# Patient Record
Sex: Male | Born: 1945 | Race: White | Hispanic: No | Marital: Married | Smoking: Former smoker
Health system: Southern US, Community
[De-identification: ages and names within clinical notes are randomized; demographics above are authoritative.]

## PROBLEM LIST (undated history)

## (undated) DIAGNOSIS — K219 Gastro-esophageal reflux disease without esophagitis: Secondary | ICD-10-CM

## (undated) DIAGNOSIS — M1990 Unspecified osteoarthritis, unspecified site: Secondary | ICD-10-CM

## (undated) DIAGNOSIS — M2540 Effusion, unspecified joint: Secondary | ICD-10-CM

## (undated) DIAGNOSIS — R072 Precordial pain: Secondary | ICD-10-CM

## (undated) DIAGNOSIS — I10 Essential (primary) hypertension: Secondary | ICD-10-CM

## (undated) DIAGNOSIS — G43109 Migraine with aura, not intractable, without status migrainosus: Secondary | ICD-10-CM

## (undated) DIAGNOSIS — M2550 Pain in unspecified joint: Secondary | ICD-10-CM

## (undated) DIAGNOSIS — E785 Hyperlipidemia, unspecified: Secondary | ICD-10-CM

## (undated) HISTORY — DX: Essential (primary) hypertension: I10

## (undated) HISTORY — PX: COLONOSCOPY: SHX174

## (undated) HISTORY — DX: Hyperlipidemia, unspecified: E78.5

## (undated) HISTORY — DX: Precordial pain: R07.2

## (undated) HISTORY — PX: CATARACT EXTRACTION: SUR2

---
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
## 2012-10-06 ENCOUNTER — Encounter: Payer: Self-pay | Admitting: Internal Medicine

## 2012-10-06 ENCOUNTER — Ambulatory Visit (INDEPENDENT_AMBULATORY_CARE_PROVIDER_SITE_OTHER): Payer: Medicare Other | Admitting: Internal Medicine

## 2012-10-06 VITALS — BP 132/82 | HR 84 | Temp 98.1°F | Ht 76.0 in | Wt 221.0 lb

## 2012-10-06 DIAGNOSIS — I10 Essential (primary) hypertension: Secondary | ICD-10-CM

## 2012-10-06 DIAGNOSIS — R0602 Shortness of breath: Secondary | ICD-10-CM

## 2012-10-06 HISTORY — DX: Shortness of breath: R06.02

## 2012-10-06 HISTORY — DX: Essential (primary) hypertension: I10

## 2012-10-06 MED ORDER — AMLODIPINE-OLMESARTAN 5-20 MG PO TABS: 1.0000 | ORAL_TABLET | Freq: Every day | ORAL | Status: DC

## 2012-10-06 NOTE — Progress Notes (Deleted)
  Subjective:    Patient ID: Chad Gordon, male    DOB: [birth year], 67 y.o.   MRN: [id number]  HPI    Review of Systems  Constitutional: Negative for fever, chills, activity change, appetite change and unexpected weight change.  HENT: Positive for congestion. Negative for sore throat, rhinorrhea, sneezing, trouble swallowing, dental problem, voice change and postnasal drip.   Eyes: Negative for visual disturbance.  Respiratory: Positive for shortness of breath. Negative for cough and choking.   Cardiovascular: Negative for chest pain and leg swelling.  Gastrointestinal: Negative for nausea, vomiting and abdominal pain.  Genitourinary: Negative for difficulty urinating.  Musculoskeletal: Negative for arthralgias.  Skin: Negative for rash.  Psychiatric/Behavioral: Negative for behavioral problems and confusion.       Objective:   Physical Exam        Assessment & Plan:

## 2012-10-06 NOTE — Assessment & Plan Note (Signed)
ACE inhibitors are problematic in  pts with airway complaints because  even experienced pulmonologists can't always distinguish ace effects from copd/asthma/pnds/ allergies etc.  By themselves they don't actually cause a problem, much like oxygen can't by itself start a fire, but they certainly serve as a powerful catalyst or enhancer for any "fire"  or inflammatory process in the upper airway, be it caused by an ET  tube or more commonly reflux (especially in the obese or pts with known GERD or who are on biphoshonates) or URI's, due to interference with bradykinin clearance.  The effects of acei on bradykinin levels occurs in 100% of pt's on acei (unless they surreptitiously stop the med!) but the classic cough is only reported in 5%.  This leaves 95% of pts on acei's  with a variety of syndromes including no identifiable symptom in most  vs non-specific symptoms that wax and wane depending on what other insult is occuring at the level of the upper airway, like GERD  For now try azor 5/20 one daily then regroup in 6 weeks

## 2012-10-06 NOTE — Progress Notes (Signed)
Subjective:     Patient ID: Chad Gordon, male   DOB: [birth year], 67 y.o.   MRN: [id number]  HPI  55 yowm quit smoking Jan 2011 c no resp problems at all referred 10/06/2012 to pulmonary clinic by Dr Sheria Lang for eval of sob  10/06/2012 Pulmonary ov / cc new onset sob x 6 months indolent but not progressive moderate to point sob taking shower, limited walking by ankle not sob. Sometimes sob sitting still but can still do golf including inclines. Has noisy breathing per wife  No obvious daytime variabilty or assoc chronic cough or cp or chest tightness, subjective wheeze on sign sinus or hb symptoms (does have occ acid reflux and nasal congestion). No unusual exp hx or h/o childhood pna/ asthma or premature birth to his knowledge.   Sleeping ok without nocturnal  or early am exacerbation  of respiratory  c/o's or need for noct saba. Also denies any obvious fluctuation of symptoms with weather or environmental changes or other aggravating or alleviating factors except as outlined above.  ROS  The following are not active complaints unless bolded sore throat, dysphagia, dental problems, itching, sneezing,  nasal congestion or excess/ purulent secretions, ear ache,   fever, chills, sweats, unintended wt loss, pleuritic or exertional cp, hemoptysis,  orthopnea pnd or leg swelling, presyncope, palpitations, heartburn, abdominal pain, anorexia, nausea, vomiting, diarrhea  or change in bowel or urinary habits, change in stools or urine, dysuria,hematuria,  rash, arthralgias, visual complaints, headache, numbness weakness or ataxia or problems with walking or coordination,  change in mood/affect or memory.      Review of Systems     Objective:   Physical Exam   10/06/2012 wt  Very pleasant amb wm with prominent pseudowheeze  HEENT: nl dentition, turbinates, and orophanx. Nl external ear canals without cough reflex   NECK :  without JVD/Nodes/TM/ nl carotid upstrokes bilaterally   LUNGS: no acc  muscle use, clear to A and P bilaterally without cough on insp or exp maneuvers   CV:  RRR  no s3 or murmur or increase in P2, no edema   ABD:  soft and nontender with nl excursion in the supine position. No bruits or organomegaly, bowel sounds nl  MS:  warm  calf tenderness, cyanosis or clubbing - bilaterl forearm amputations  SKIN: warm and dry without lesions    NEURO:  alert, approp, no deficits  cxr 09/04/12 wnl        Assessment:           Plan:

## 2012-10-06 NOTE — Patient Instructions (Addendum)
Stop amlodipine/benapril and start azor 5/20 one daily  Continue Nexium 40 mg Take 30-60 min before first meal of the day   GERD (REFLUX)  is an extremely common cause of respiratory symptoms, many times with no significant heartburn at all.    It can be treated with medication, but also with lifestyle changes including avoidance of late meals, excessive alcohol, smoking cessation, and avoid fatty foods, chocolate, peppermint, colas, red wine, and acidic juices such as orange juice.  NO MINT OR MENTHOL PRODUCTS SO NO COUGH DROPS  USE SUGARLESS CANDY INSTEAD (jolley ranchers or Stover's)  NO OIL BASED VITAMINS - use powdered substitutes.  Please schedule a follow up office visit in 6 weeks, call sooner if needed with pfts

## 2012-10-06 NOTE — Assessment & Plan Note (Signed)
Symptoms are markedly disproportionate to objective findings and not clear this is a lung problem but pt does appear to have difficult airway management issues. DDX of  difficult airways managment all start with A and  include Adherence, Ace Inhibitors, Acid Reflux, Active Sinus Disease, Alpha 1 Antitripsin deficiency, Anxiety masquerading as Airways dz,  ABPA,  allergy(esp in young), Aspiration (esp in elderly), Adverse effects of DPI,  Active smokers, plus two Bs  = Bronchiectasis and Beta blocker use..and one C= CHF  Ace inhibitors lead the list of usual suspects here even in the absence of the classic cough > try off x 6 weeks then regroup with pft  Acid reflux > continue nexium, but note the effects of GERD and ACEI on the upper airway are like oxygen and gasoline (all it takes is a spark and pts stable for years on acei can start having upper airway issues).  Active smoking > denies.   When respiratory symptoms begin or become refractory well after a patient reports complete smoking cessation,  Especially when this wasn't the case while they were smoking, a red flag is raised based on the work of Dr Primitivo Gauze which states:  if you quit smoking when your best day FEV1 is still well preserved it is highly unlikely you will progress to severe disease.  That is to say, once the smoking stops,  the symptoms should not suddenly erupt or markedly worsen.  If so, the differential diagnosis should include  obesity/deconditioning,  LPR/Reflux/Aspiration syndromes,  occult CHF, or  especially side effect of medications commonly used in this population, esp ace inhibitors

## 2012-11-17 ENCOUNTER — Encounter: Payer: Self-pay | Admitting: Internal Medicine

## 2012-11-17 ENCOUNTER — Ambulatory Visit (INDEPENDENT_AMBULATORY_CARE_PROVIDER_SITE_OTHER): Payer: Medicare Other | Admitting: Internal Medicine

## 2012-11-17 VITALS — BP 110/68 | HR 88 | Temp 98.5°F | Ht 75.0 in | Wt 216.0 lb

## 2012-11-17 DIAGNOSIS — J310 Chronic rhinitis: Secondary | ICD-10-CM

## 2012-11-17 DIAGNOSIS — I10 Essential (primary) hypertension: Secondary | ICD-10-CM

## 2012-11-17 DIAGNOSIS — R0602 Shortness of breath: Secondary | ICD-10-CM

## 2012-11-17 HISTORY — DX: Chronic rhinitis: J31.0

## 2012-11-17 LAB — PULMONARY FUNCTION TEST

## 2012-11-17 MED ORDER — AMLODIPINE-OLMESARTAN 5-20 MG PO TABS: 1.0000 | ORAL_TABLET | Freq: Every day | ORAL | Status: DC

## 2012-11-17 MED ORDER — AZELASTINE-FLUTICASONE 137-50 MCG/ACT NA SUSP: 1.0000 | Freq: Two times a day (BID) | NASAL | Status: DC

## 2012-11-17 NOTE — Progress Notes (Signed)
Subjective:     Patient ID: Chad Gordon, male   DOB: Feb 28, 1946    MRN: [id number]  HPI  1 yowm quit smoking Jan 2011 c no resp problems at all referred 10/06/2012 to pulmonary clinic by Dr Sheria Lang for eval of sob  10/06/2012 Pulmonary ov / cc new onset sob x 6 months indolent but not progressive moderate to point sob taking shower, limited walking by ankle not sob. Sometimes sob sitting still but can still do golf including inclines. Has noisy breathing per wife. rec Stop amlodipine/benapril and start azor 5/20 one daily Continue Nexium 40 mg Take 30-60 min before first meal of the day  GERD   11/17/2012 f/u ov/Kiylee Thoreson f/u sob Chief Complaint  Patient presents with  . Follow-up    Breathing has improved some since last visit. Had PFT done today.    shower better, no longer sob sitting still - main issue is sob bending over, breathing through the nose is a problem,can't walk distance due to ankle.   No obvious daytime variabilty or assoc chronic cough or cp or chest tightness, subjective wheeze on sign sinus or hb symptoms (does have occ acid reflux and nasal congestion). No unusual exp hx or h/o childhood pna/ asthma or premature birth to his knowledge.   Sleeping ok without nocturnal  or early am exacerbation  of respiratory  c/o's or need for noct saba. Also denies any obvious fluctuation of symptoms with weather or environmental changes or other aggravating or alleviating factors except as outlined above.  ROS  The following are not active complaints unless bolded sore throat, dysphagia, dental problems, itching, sneezing,  nasal congestion or excess/ purulent secretions, ear ache,   fever, chills, sweats, unintended wt loss, pleuritic or exertional cp, hemoptysis,  orthopnea pnd or leg swelling, presyncope, palpitations, heartburn, abdominal pain, anorexia, nausea, vomiting, diarrhea  or change in bowel or urinary habits, change in stools or urine, dysuria,hematuria,  rash, arthralgias,  visual complaints, headache, numbness weakness or ataxia or problems with walking or coordination,  change in mood/affect or memory.           Objective:   Physical Exam    Wt Readings from Last 3 Encounters:  11/17/12 216 lb (97.977 kg)  10/06/12 221 lb (100.245 kg)    Very pleasant amb wm with  pseudowheeze only with fvc  HEENT: nl dentition, turbinates, and orophanx. Nl external ear canals without cough reflex   NECK :  without JVD/Nodes/TM/ nl carotid upstrokes bilaterally   LUNGS: no acc muscle use, clear to A and P bilaterally without cough on insp or exp maneuvers   CV:  RRR  no s3 or murmur or increase in P2, no edema   ABD:  soft and nontender with nl excursion in the supine position. No bruits or organomegaly, bowel sounds nl  MS:  warm  calf tenderness, cyanosis or clubbing - bilaterl forearm amputations    cxr 09/04/12 wnl        Assessment:           Plan:

## 2012-11-17 NOTE — Assessment & Plan Note (Signed)
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 I reviewed the Flethcher curve with patient that basically indicates  if you quit smoking when your best day FEV1 is still well preserved(as it certainly is)  it is highly unlikely you will progress to severe disease and informed the patient there was no medication on the market that has proven to change the curve or the likelihood of progression.  Therefore stopping smoking and maintaining abstinence is the most important aspect of care, not choice of inhalers or for that matter, doctors.   No need for pulmonary f/u for this

## 2012-11-17 NOTE — Assessment & Plan Note (Signed)
rec sinus ct and trial of dymista with ent f/u if mechanical obst and allergy eval if not and dymista not helping

## 2012-11-17 NOTE — Progress Notes (Signed)
PFT done today. 

## 2012-11-17 NOTE — Patient Instructions (Addendum)
dymista one puff twice daily to see if nasal passages improve  Please see patient coordinator before you leave today  to schedule sinus CT    If you are satisfied with your treatment plan let your doctor know and he/she can either refill your medications or you can return here when your prescription runs out.     If in any way you are not 100% satisfied,  please tell us.  If 100% better, tell your friends!

## 2012-11-17 NOTE — Assessment & Plan Note (Signed)
Ok off acei since 10/06/12 > defer final choice to Dr Sheria Lang.

## 2012-11-25 ENCOUNTER — Encounter: Payer: Self-pay | Admitting: Internal Medicine

## 2012-11-25 ENCOUNTER — Ambulatory Visit (INDEPENDENT_AMBULATORY_CARE_PROVIDER_SITE_OTHER)
Admission: RE | Admit: 2012-11-25 | Discharge: 2012-11-25 | Disposition: A | Discharge status: Home / Self Care | Payer: Medicare Other | Source: Ambulatory Visit | Attending: Internal Medicine | Admitting: Internal Medicine

## 2012-11-25 DIAGNOSIS — J310 Chronic rhinitis: Secondary | ICD-10-CM

## 2012-11-26 NOTE — Progress Notes (Signed)
Quick Note:  Spoke with pt and notified of results per Dr. Wert. Pt verbalized understanding and denied any questions.  ______ 

## 2013-02-03 HISTORY — PX: KNEE ARTHROSCOPY: SHX127

## 2013-04-30 ENCOUNTER — Other Ambulatory Visit: Payer: Self-pay | Admitting: Orthopedic Surgery

## 2013-05-08 ENCOUNTER — Encounter (HOSPITAL_COMMUNITY): Payer: Self-pay | Admitting: Pharmacy Technician

## 2013-05-11 NOTE — Pre-Procedure Instructions (Signed)
Chad Gordon  05/11/2013   Your procedure is scheduled on:  Mon, Oct 13 @ 8:45 AM  Report to [facility] Short Stay Entrance A at 6:45AM.  Call this number if you have problems the morning of surgery: [phone]   Remember:   Do not eat food or drink liquids after midnight.   Take these medicines the morning of surgery with A SIP OF WATER: Amlodipine-Olmesartan(Azor) and Nexium(Esomeprazole)               Stop taking your Aspirin and Ibuprofen.No Goody's,BC's,Aleve,Fish Oil,or any Herbal Medications   Do not wear jewelry  Do not wear lotions, powders, orcolognes. You may wear deodorant.  Men may shave face and neck.  Do not bring valuables to the hospital.  [facility] is not responsible                  for any belongings or valuables.               Contacts, dentures or bridgework may not be worn into surgery.  Leave suitcase in the car. After surgery it may be brought to your room.  For patients admitted to the hospital, discharge time is determined by your                treatment team.               Patients discharged the day of surgery will not be allowed to drive  home.    Special Instructions: Shower using CHG 2 nights before surgery and the night before surgery.  If you shower the day of surgery use CHG.  Use special wash - you have one bottle of CHG for all showers.  You should use approximately 1/3 of the bottle for each shower.   Please read over the following fact sheets that you were given: Pain Booklet, Coughing and Deep Breathing, Blood Transfusion Information, Total Joint Packet, MRSA Information and Surgical Site Infection Prevention

## 2013-05-12 ENCOUNTER — Encounter (HOSPITAL_COMMUNITY): Payer: Self-pay

## 2013-05-12 ENCOUNTER — Encounter (HOSPITAL_COMMUNITY)
Admission: RE | Admit: 2013-05-12 | Discharge: 2013-05-12 | Disposition: A | Discharge status: Home / Self Care | Payer: Medicare Other | Source: Ambulatory Visit | Attending: Orthopedic Surgery | Admitting: Orthopedic Surgery

## 2013-05-12 DIAGNOSIS — Z01818 Encounter for other preprocedural examination: Secondary | ICD-10-CM | POA: Insufficient documentation

## 2013-05-12 DIAGNOSIS — Z01810 Encounter for preprocedural cardiovascular examination: Secondary | ICD-10-CM | POA: Insufficient documentation

## 2013-05-12 DIAGNOSIS — Z01812 Encounter for preprocedural laboratory examination: Secondary | ICD-10-CM | POA: Insufficient documentation

## 2013-05-12 HISTORY — DX: Unspecified osteoarthritis, unspecified site: M19.90

## 2013-05-12 HISTORY — DX: Effusion, unspecified joint: M25.40

## 2013-05-12 HISTORY — DX: Pain in unspecified joint: M25.50

## 2013-05-12 HISTORY — DX: Gastro-esophageal reflux disease without esophagitis: K21.9

## 2013-05-12 LAB — COMPREHENSIVE METABOLIC PANEL
ALT: 29 U/L (ref 0–53)
Alkaline Phosphatase: 90 U/L (ref 39–117)
CO2: 24 mEq/L (ref 19–32)
Calcium: 9.1 mg/dL (ref 8.4–10.5)
Chloride: 103 mEq/L (ref 96–112)
GFR calc Af Amer: >90 mL/min (ref >90)
Glucose, Bld: 88 mg/dL (ref 70–99)
Potassium: 3.8 mEq/L (ref 3.5–5.1)
Sodium: 138 mEq/L (ref 135–145)
Total Bilirubin: 0.4 mg/dL (ref 0.3–1.2)
Total Protein: 6.9 g/dL (ref 6.0–8.3)

## 2013-05-12 LAB — CBC WITH DIFFERENTIAL/PLATELET
Eosinophils Absolute: 0.1 10*3/uL (ref 0.0–0.7)
Lymphocytes Relative: 18 % (ref 12–46)
Lymphs Abs: 1.9 10*3/uL (ref 0.7–4.0)
MCV: 85.1 fL (ref 78.0–100.0)
Monocytes Absolute: 0.9 10*3/uL (ref 0.1–1.0)
Monocytes Relative: 9 % (ref 3–12)
Neutrophils Relative %: 71 % (ref 43–77)
Platelets: 407 10*3/uL — ABNORMAL HIGH (ref 150–400)
RBC: 5.02 MIL/uL (ref 4.22–5.81)
WBC: 10.3 10*3/uL (ref 4.0–10.5)

## 2013-05-12 LAB — SURGICAL PCR SCREEN
MRSA, PCR: NEGATIVE
Staphylococcus aureus: NEGATIVE

## 2013-05-12 LAB — APTT: aPTT: 32 seconds (ref 24–37)

## 2013-05-12 LAB — ABO/RH: ABO/RH(D): A NEG

## 2013-05-12 LAB — TYPE AND SCREEN: Antibody Screen: NEGATIVE

## 2013-05-12 LAB — PROTIME-INR: Prothrombin Time: 13.2 seconds (ref 11.6–15.2)

## 2013-05-12 MED ORDER — CHLORHEXIDINE GLUCONATE 4 % EX LIQD: 60.0000 mL | Freq: Once | CUTANEOUS | Status: DC

## 2013-05-12 NOTE — Progress Notes (Signed)
Pt doesn't have a cardiologist  Denies ever having a stress test  Echo done about 35yrs ago-done with Dr.John Sheria Lang at [facility]  Denies ever having a heart cath  Medical MD is Dr.John Sheria Lang  CXR to be requested from [facility]  Denies ekg in past yr

## 2013-05-12 NOTE — Progress Notes (Signed)
05/12/13 1200  OBSTRUCTIVE SLEEP APNEA  Have you ever been diagnosed with sleep apnea through a sleep study? No  Do you snore loudly (loud enough to be heard through closed doors)?  1  Do you often feel tired, fatigued, or sleepy during the daytime? 0  Has anyone observed you stop breathing during your sleep? 1  Do you have, or are you being treated for high blood pressure? 1  BMI more than 35 kg/m2? 0  Age over 69 years old? 1  Neck circumference greater than 40 cm/18 inches? 0 (17)  Gender: 1  Obstructive Sleep Apnea Score 5  Score 4 or greater  Results sent to PCP

## 2013-05-14 NOTE — Progress Notes (Signed)
Anesthesia Chart Review:  Patient is a 67 year old male scheduled for right TKA on 05/18/13 by Dr. Sherlean Foot.  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  PCP is Dr. Desmond Dike at [facility].  He has cleared patient for this procedure.  Patient was also evaluated by pulmonologist Dr. Sandrea Hughs earlier this year for chronic rhinitis and SOB (possible related to ACEI).  He was also felt to have GOLD I COPD, but felt it would not likely progress if he refrained from smoking.  It appears only PRN follow-up was recommended.  EKG on 05/12/13 showed NSR. He reported a prior echo, but it was done > 3 years ago.   CXR from his PCP done on 09/04/12 showed faint infiltrate in the left upper lobe.  I will order a follow-up CXR to be done on the day of surgery.   PFT's 11/17/2012 FEV1 3.01 (87%) ratio 67 and no better p B2, DLCO 93%.  Preoperative labs noted.  He was unable to provide a urine specimen, so it will be done on the day of surgery.  He has been cleared by his PCP, so if no acute changes then I would anticipate that he could proceed as planned.  Velna Ochs [facility] Short Stay Center/Anesthesiology Phone [phone] 05/14/2013 1:59 PM

## 2013-05-17 MED ORDER — BUPIVACAINE LIPOSOME 1.3 % IJ SUSP
20.0000 mL | Freq: Once | INTRAMUSCULAR | Status: DC
  Filled 2013-05-17: qty 20

## 2013-05-17 MED ORDER — DEXTROSE 5 % IV SOLN
3.0000 g | INTRAVENOUS | Status: AC
  Administered 2013-05-18: 3 g via INTRAVENOUS
  Filled 2013-05-17: qty 3000

## 2013-05-17 MED ORDER — TRANEXAMIC ACID 100 MG/ML IV SOLN
1000.0000 mg | INTRAVENOUS | Status: AC
  Administered 2013-05-18: 1000 mg via INTRAVENOUS
  Filled 2013-05-17: qty 10

## 2013-05-17 MED ORDER — SODIUM CHLORIDE 0.9 % IV SOLN: INTRAVENOUS | Status: DC

## 2013-05-18 ENCOUNTER — Encounter (HOSPITAL_COMMUNITY)
Admission: RE | Disposition: A | Discharge status: Home Health | Payer: Self-pay | Source: Ambulatory Visit | Attending: Orthopedic Surgery

## 2013-05-18 ENCOUNTER — Encounter (HOSPITAL_COMMUNITY): Payer: Self-pay | Admitting: Certified Registered"

## 2013-05-18 ENCOUNTER — Inpatient Hospital Stay (HOSPITAL_COMMUNITY): Payer: Medicare Other | Admitting: Certified Registered"

## 2013-05-18 ENCOUNTER — Encounter (HOSPITAL_COMMUNITY): Payer: Medicare Other | Admitting: Vascular Surgery

## 2013-05-18 ENCOUNTER — Inpatient Hospital Stay (HOSPITAL_COMMUNITY)
Admission: RE | Admit: 2013-05-18 | Discharge: 2013-05-19 | DRG: 470 | Disposition: A | Discharge status: Home Health | Payer: Medicare Other | Source: Ambulatory Visit | Attending: Orthopedic Surgery | Admitting: Orthopedic Surgery

## 2013-05-18 ENCOUNTER — Inpatient Hospital Stay (HOSPITAL_COMMUNITY): Payer: Medicare Other

## 2013-05-18 DIAGNOSIS — Z23 Encounter for immunization: Secondary | ICD-10-CM

## 2013-05-18 DIAGNOSIS — M1710 Unilateral primary osteoarthritis, unspecified knee: Principal | ICD-10-CM | POA: Diagnosis present

## 2013-05-18 DIAGNOSIS — J310 Chronic rhinitis: Secondary | ICD-10-CM | POA: Diagnosis present

## 2013-05-18 DIAGNOSIS — D62 Acute posthemorrhagic anemia: Secondary | ICD-10-CM | POA: Diagnosis not present

## 2013-05-18 DIAGNOSIS — Z96651 Presence of right artificial knee joint: Secondary | ICD-10-CM

## 2013-05-18 DIAGNOSIS — Z87891 Personal history of nicotine dependence: Secondary | ICD-10-CM

## 2013-05-18 DIAGNOSIS — K219 Gastro-esophageal reflux disease without esophagitis: Secondary | ICD-10-CM | POA: Diagnosis present

## 2013-05-18 DIAGNOSIS — I10 Essential (primary) hypertension: Secondary | ICD-10-CM | POA: Diagnosis present

## 2013-05-18 DIAGNOSIS — E785 Hyperlipidemia, unspecified: Secondary | ICD-10-CM | POA: Diagnosis present

## 2013-05-18 HISTORY — PX: TOTAL KNEE ARTHROPLASTY: SHX125

## 2013-05-18 LAB — CREATININE, SERUM: Creatinine, Ser: 0.96 mg/dL (ref 0.50–1.35)

## 2013-05-18 LAB — CBC
HCT: 40.1 % (ref 39.0–52.0)
MCH: 28.6 pg (ref 26.0–34.0)
MCV: 85.5 fL (ref 78.0–100.0)
Platelets: 368 10*3/uL (ref 150–400)
RBC: 4.69 MIL/uL (ref 4.22–5.81)
RDW: 15 % (ref 11.5–15.5)

## 2013-05-18 LAB — URINALYSIS, ROUTINE W REFLEX MICROSCOPIC
Bilirubin Urine: NEGATIVE
Bilirubin Urine: NEGATIVE
Glucose, UA: NEGATIVE mg/dL
Glucose, UA: NEGATIVE mg/dL
Hgb urine dipstick: NEGATIVE
Leukocytes, UA: NEGATIVE
Nitrite: NEGATIVE
Protein, ur: NEGATIVE mg/dL
Specific Gravity, Urine: 1.023 (ref 1.005–1.030)
Urobilinogen, UA: 0.2 mg/dL (ref 0.0–1.0)
Urobilinogen, UA: 0.2 mg/dL (ref 0.0–1.0)
pH: 6 (ref 5.0–8.0)

## 2013-05-18 SURGERY — ARTHROPLASTY, KNEE, TOTAL
Anesthesia: General | Site: Knee | Laterality: Right | Wound class: Clean

## 2013-05-18 MED ORDER — MENTHOL 3 MG MT LOZG: 1.0000 | LOZENGE | OROMUCOSAL | Status: DC | PRN

## 2013-05-18 MED ORDER — HYDROMORPHONE HCL PF 1 MG/ML IJ SOLN
0.2500 mg | INTRAMUSCULAR | Status: DC | PRN
  Administered 2013-05-18 (×2): 0.5 mg via INTRAVENOUS

## 2013-05-18 MED ORDER — METOCLOPRAMIDE HCL 5 MG/ML IJ SOLN
5.0000 mg | Freq: Three times a day (TID) | INTRAMUSCULAR | Status: DC | PRN
  Administered 2013-05-18: 10 mg via INTRAVENOUS
  Filled 2013-05-18: qty 2

## 2013-05-18 MED ORDER — SODIUM CHLORIDE 0.9 % IV SOLN
INTRAVENOUS | Status: DC
  Administered 2013-05-18: 17:00:00 via INTRAVENOUS

## 2013-05-18 MED ORDER — OXYCODONE HCL ER 10 MG PO T12A
10.0000 mg | EXTENDED_RELEASE_TABLET | Freq: Two times a day (BID) | ORAL | Status: DC
  Administered 2013-05-18 – 2013-05-19 (×2): 10 mg via ORAL
  Filled 2013-05-18 (×2): qty 1

## 2013-05-18 MED ORDER — CEFAZOLIN SODIUM-DEXTROSE 2-3 GM-% IV SOLR
2.0000 g | Freq: Four times a day (QID) | INTRAVENOUS | Status: AC
  Administered 2013-05-18: 2 g via INTRAVENOUS
  Filled 2013-05-18 (×2): qty 50

## 2013-05-18 MED ORDER — ONDANSETRON HCL 4 MG PO TABS: 4.0000 mg | ORAL_TABLET | Freq: Four times a day (QID) | ORAL | Status: DC | PRN

## 2013-05-18 MED ORDER — ARTIFICIAL TEARS OP OINT
TOPICAL_OINTMENT | OPHTHALMIC | Status: DC | PRN
  Administered 2013-05-18: 1 via OPHTHALMIC

## 2013-05-18 MED ORDER — PROMETHAZINE HCL 12.5 MG PO TABS: 12.5000 mg | ORAL_TABLET | Freq: Four times a day (QID) | ORAL | Status: DC | PRN

## 2013-05-18 MED ORDER — MIDAZOLAM HCL 2 MG/2ML IJ SOLN: 0.5000 mg | Freq: Once | INTRAMUSCULAR | Status: DC | PRN

## 2013-05-18 MED ORDER — FENTANYL CITRATE 0.05 MG/ML IJ SOLN
50.0000 ug | INTRAMUSCULAR | Status: DC | PRN
  Administered 2013-05-18: 100 ug via INTRAVENOUS
  Filled 2013-05-18: qty 2

## 2013-05-18 MED ORDER — FENTANYL CITRATE 0.05 MG/ML IJ SOLN
INTRAMUSCULAR | Status: DC | PRN
  Administered 2013-05-18: 25 ug via INTRAVENOUS
  Administered 2013-05-18: 50 ug via INTRAVENOUS
  Administered 2013-05-18: 100 ug via INTRAVENOUS
  Administered 2013-05-18 (×2): 50 ug via INTRAVENOUS
  Administered 2013-05-18: 25 ug via INTRAVENOUS

## 2013-05-18 MED ORDER — LACTATED RINGERS IV SOLN
INTRAVENOUS | Status: DC | PRN
  Administered 2013-05-18 (×3): via INTRAVENOUS

## 2013-05-18 MED ORDER — METHOCARBAMOL 100 MG/ML IJ SOLN
500.0000 mg | Freq: Four times a day (QID) | INTRAVENOUS | Status: DC | PRN
  Filled 2013-05-18: qty 5

## 2013-05-18 MED ORDER — SIMVASTATIN 20 MG PO TABS
20.0000 mg | ORAL_TABLET | Freq: Every day | ORAL | Status: DC
  Administered 2013-05-18: 20 mg via ORAL
  Filled 2013-05-18 (×2): qty 1

## 2013-05-18 MED ORDER — DIPHENHYDRAMINE HCL 12.5 MG/5ML PO ELIX: 12.5000 mg | ORAL_SOLUTION | ORAL | Status: DC | PRN

## 2013-05-18 MED ORDER — LACTATED RINGERS IV SOLN
INTRAVENOUS | Status: DC
  Administered 2013-05-18: 08:00:00 via INTRAVENOUS

## 2013-05-18 MED ORDER — ZOLPIDEM TARTRATE 5 MG PO TABS: 5.0000 mg | ORAL_TABLET | Freq: Every evening | ORAL | Status: DC | PRN

## 2013-05-18 MED ORDER — PROMETHAZINE HCL 25 MG/ML IJ SOLN
12.5000 mg | Freq: Four times a day (QID) | INTRAMUSCULAR | Status: DC | PRN
  Administered 2013-05-18: 12.5 mg via INTRAVENOUS
  Filled 2013-05-18: qty 1

## 2013-05-18 MED ORDER — BISACODYL 5 MG PO TBEC: 5.0000 mg | DELAYED_RELEASE_TABLET | Freq: Every day | ORAL | Status: DC | PRN

## 2013-05-18 MED ORDER — PHENOL 1.4 % MT LIQD: 1.0000 | OROMUCOSAL | Status: DC | PRN

## 2013-05-18 MED ORDER — CELECOXIB 200 MG PO CAPS
200.0000 mg | ORAL_CAPSULE | Freq: Two times a day (BID) | ORAL | Status: DC
  Administered 2013-05-18 – 2013-05-19 (×3): 200 mg via ORAL
  Filled 2013-05-18 (×4): qty 1

## 2013-05-18 MED ORDER — ONDANSETRON HCL 4 MG/2ML IJ SOLN
INTRAMUSCULAR | Status: DC | PRN
  Administered 2013-05-18: 4 mg via INTRAMUSCULAR

## 2013-05-18 MED ORDER — PHENYLEPHRINE HCL 10 MG/ML IJ SOLN
INTRAMUSCULAR | Status: DC | PRN
  Administered 2013-05-18 (×5): 80 ug via INTRAVENOUS
  Administered 2013-05-18: 40 ug via INTRAVENOUS
  Administered 2013-05-18: 80 ug via INTRAVENOUS
  Administered 2013-05-18: 40 ug via INTRAVENOUS
  Administered 2013-05-18: 80 ug via INTRAVENOUS

## 2013-05-18 MED ORDER — EPHEDRINE SULFATE 50 MG/ML IJ SOLN
INTRAMUSCULAR | Status: DC | PRN
  Administered 2013-05-18: 5 mg via INTRAVENOUS

## 2013-05-18 MED ORDER — SODIUM CHLORIDE 0.9 % IJ SOLN
INTRAMUSCULAR | Status: DC | PRN
  Administered 2013-05-18: 09:00:00

## 2013-05-18 MED ORDER — AMLODIPINE-OLMESARTAN 5-20 MG PO TABS: 1.0000 | ORAL_TABLET | Freq: Every day | ORAL | Status: DC

## 2013-05-18 MED ORDER — OXYCODONE HCL 5 MG PO TABS
5.0000 mg | ORAL_TABLET | ORAL | Status: DC | PRN
  Administered 2013-05-18 – 2013-05-19 (×2): 10 mg via ORAL
  Filled 2013-05-18 (×2): qty 2

## 2013-05-18 MED ORDER — BUPIVACAINE HCL (PF) 0.5 % IJ SOLN
INTRAMUSCULAR | Status: DC | PRN
  Administered 2013-05-18: 30 mL

## 2013-05-18 MED ORDER — MIDAZOLAM HCL 2 MG/2ML IJ SOLN
1.0000 mg | INTRAMUSCULAR | Status: DC | PRN
  Administered 2013-05-18: 2 mg via INTRAVENOUS
  Filled 2013-05-18: qty 2

## 2013-05-18 MED ORDER — HYDROMORPHONE HCL PF 1 MG/ML IJ SOLN
1.0000 mg | INTRAMUSCULAR | Status: DC | PRN
  Administered 2013-05-18 (×2): 1 mg via INTRAVENOUS
  Filled 2013-05-18 (×2): qty 1

## 2013-05-18 MED ORDER — IRBESARTAN 150 MG PO TABS
150.0000 mg | ORAL_TABLET | Freq: Every day | ORAL | Status: DC
  Administered 2013-05-19: 150 mg via ORAL
  Filled 2013-05-18: qty 1

## 2013-05-18 MED ORDER — METHOCARBAMOL 500 MG PO TABS
500.0000 mg | ORAL_TABLET | Freq: Four times a day (QID) | ORAL | Status: DC | PRN
  Administered 2013-05-18 (×2): 500 mg via ORAL
  Filled 2013-05-18 (×2): qty 1

## 2013-05-18 MED ORDER — OXYCODONE HCL 5 MG PO TABS: 5.0000 mg | ORAL_TABLET | Freq: Once | ORAL | Status: DC | PRN

## 2013-05-18 MED ORDER — PANTOPRAZOLE SODIUM 40 MG PO TBEC
80.0000 mg | DELAYED_RELEASE_TABLET | Freq: Every day | ORAL | Status: DC
  Administered 2013-05-19: 80 mg via ORAL
  Filled 2013-05-18: qty 2

## 2013-05-18 MED ORDER — SENNOSIDES-DOCUSATE SODIUM 8.6-50 MG PO TABS: 1.0000 | ORAL_TABLET | Freq: Every evening | ORAL | Status: DC | PRN

## 2013-05-18 MED ORDER — LIDOCAINE HCL (CARDIAC) 20 MG/ML IV SOLN
INTRAVENOUS | Status: DC | PRN
  Administered 2013-05-18: 30 mg via INTRAVENOUS

## 2013-05-18 MED ORDER — ACETAMINOPHEN 650 MG RE SUPP: 650.0000 mg | Freq: Four times a day (QID) | RECTAL | Status: DC | PRN

## 2013-05-18 MED ORDER — ENOXAPARIN SODIUM 30 MG/0.3ML ~~LOC~~ SOLN
30.0000 mg | Freq: Two times a day (BID) | SUBCUTANEOUS | Status: DC
Start: 2013-05-19 — End: 2013-05-19
  Administered 2013-05-19: 30 mg via SUBCUTANEOUS
  Filled 2013-05-18 (×3): qty 0.3

## 2013-05-18 MED ORDER — ACETAMINOPHEN 325 MG PO TABS: 650.0000 mg | ORAL_TABLET | Freq: Four times a day (QID) | ORAL | Status: DC | PRN

## 2013-05-18 MED ORDER — INFLUENZA VAC SPLIT QUAD 0.5 ML IM SUSP
0.5000 mL | INTRAMUSCULAR | Status: AC
  Administered 2013-05-19: 0.5 mL via INTRAMUSCULAR
  Filled 2013-05-18: qty 0.5

## 2013-05-18 MED ORDER — METOCLOPRAMIDE HCL 10 MG PO TABS: 5.0000 mg | ORAL_TABLET | Freq: Three times a day (TID) | ORAL | Status: DC | PRN

## 2013-05-18 MED ORDER — ALUM & MAG HYDROXIDE-SIMETH 200-200-20 MG/5ML PO SUSP: 30.0000 mL | ORAL | Status: DC | PRN

## 2013-05-18 MED ORDER — HYDROMORPHONE HCL PF 1 MG/ML IJ SOLN
INTRAMUSCULAR | Status: AC
  Administered 2013-05-18: 0.5 mg via INTRAVENOUS
  Filled 2013-05-18: qty 1

## 2013-05-18 MED ORDER — SODIUM CHLORIDE 0.9 % IR SOLN
Status: DC | PRN
  Administered 2013-05-18: 3000 mL

## 2013-05-18 MED ORDER — PROPOFOL 10 MG/ML IV BOLUS
INTRAVENOUS | Status: DC | PRN
  Administered 2013-05-18: 160 mg via INTRAVENOUS

## 2013-05-18 MED ORDER — DOCUSATE SODIUM 100 MG PO CAPS
100.0000 mg | ORAL_CAPSULE | Freq: Two times a day (BID) | ORAL | Status: DC
  Administered 2013-05-18 – 2013-05-19 (×2): 100 mg via ORAL
  Filled 2013-05-18 (×3): qty 1

## 2013-05-18 MED ORDER — PROMETHAZINE HCL 25 MG RE SUPP: 12.5000 mg | Freq: Four times a day (QID) | RECTAL | Status: DC | PRN

## 2013-05-18 MED ORDER — FLEET ENEMA 7-19 GM/118ML RE ENEM: 1.0000 | ENEMA | Freq: Once | RECTAL | Status: AC | PRN

## 2013-05-18 MED ORDER — AMLODIPINE BESYLATE 5 MG PO TABS
5.0000 mg | ORAL_TABLET | Freq: Every day | ORAL | Status: DC
  Administered 2013-05-19: 5 mg via ORAL
  Filled 2013-05-18: qty 1

## 2013-05-18 MED ORDER — ONDANSETRON HCL 4 MG/2ML IJ SOLN
4.0000 mg | Freq: Four times a day (QID) | INTRAMUSCULAR | Status: DC | PRN
  Administered 2013-05-18: 4 mg via INTRAVENOUS
  Filled 2013-05-18: qty 2

## 2013-05-18 MED ORDER — OXYCODONE HCL 5 MG/5ML PO SOLN: 5.0000 mg | Freq: Once | ORAL | Status: DC | PRN

## 2013-05-18 SURGICAL SUPPLY — 55 items
BANDAGE ESMARK 6X9 LF (GAUZE/BANDAGES/DRESSINGS) ×1 IMPLANT
BLADE SAGITTAL 13X1.27X60 (BLADE) ×2 IMPLANT
BLADE SAW SGTL 83.5X18.5 (BLADE) ×2 IMPLANT
BNDG ESMARK 6X9 LF (GAUZE/BANDAGES/DRESSINGS) ×2
BOWL SMART MIX CTS (DISPOSABLE) ×2 IMPLANT
CAP POR TM CP VIT E LN CER HD ×2 IMPLANT
CEMENT BONE SIMPLEX SPEEDSET (Cement) ×4 IMPLANT
CLOTH BEACON ORANGE TIMEOUT ST (SAFETY) IMPLANT
COVER SURGICAL LIGHT HANDLE (MISCELLANEOUS) ×2 IMPLANT
CUFF TOURNIQUET SINGLE 34IN LL (TOURNIQUET CUFF) ×2 IMPLANT
DRAPE EXTREMITY T 121X128X90 (DRAPE) ×2 IMPLANT
DRAPE INCISE IOBAN 66X45 STRL (DRAPES) ×4 IMPLANT
DRAPE PROXIMA HALF (DRAPES) ×2 IMPLANT
DRAPE U-SHAPE 47X51 STRL (DRAPES) ×2 IMPLANT
DRSG ADAPTIC 3X8 NADH LF (GAUZE/BANDAGES/DRESSINGS) ×2 IMPLANT
DRSG PAD ABDOMINAL 8X10 ST (GAUZE/BANDAGES/DRESSINGS) ×2 IMPLANT
DURAPREP 26ML APPLICATOR (WOUND CARE) ×4 IMPLANT
ELECT REM PT RETURN 9FT ADLT (ELECTROSURGICAL) ×2
ELECTRODE REM PT RTRN 9FT ADLT (ELECTROSURGICAL) ×1 IMPLANT
EVACUATOR 1/8 PVC DRAIN (DRAIN) ×2 IMPLANT
GLOVE BIOGEL M 7.0 STRL (GLOVE) ×2 IMPLANT
GLOVE BIOGEL PI IND STRL 7.5 (GLOVE) ×1 IMPLANT
GLOVE BIOGEL PI IND STRL 8.5 (GLOVE) ×1 IMPLANT
GLOVE BIOGEL PI INDICATOR 7.5 (GLOVE) ×1
GLOVE BIOGEL PI INDICATOR 8.5 (GLOVE) ×1
GLOVE SURG ORTHO 8.0 STRL STRW (GLOVE) ×4 IMPLANT
GOWN PREVENTION PLUS XLARGE (GOWN DISPOSABLE) ×6 IMPLANT
GOWN STRL NON-REIN LRG LVL3 (GOWN DISPOSABLE) IMPLANT
HANDPIECE INTERPULSE COAX TIP (DISPOSABLE) ×1
HOOD PEEL AWAY FACE SHEILD DIS (HOOD) ×6 IMPLANT
KIT BASIN OR (CUSTOM PROCEDURE TRAY) ×2 IMPLANT
KIT ROOM TURNOVER OR (KITS) ×2 IMPLANT
MANIFOLD NEPTUNE II (INSTRUMENTS) ×2 IMPLANT
NEEDLE 22X1 1/2 (OR ONLY) (NEEDLE) ×2 IMPLANT
NEEDLE HYPO 21X1.5 SAFETY (NEEDLE) ×2 IMPLANT
NS IRRIG 1000ML POUR BTL (IV SOLUTION) ×2 IMPLANT
PACK TOTAL JOINT (CUSTOM PROCEDURE TRAY) ×2 IMPLANT
PAD ARMBOARD 7.5X6 YLW CONV (MISCELLANEOUS) ×4 IMPLANT
PADDING CAST COTTON 6X4 STRL (CAST SUPPLIES) ×2 IMPLANT
POSITIONER HEAD PRONE TRACH (MISCELLANEOUS) ×2 IMPLANT
SET HNDPC FAN SPRY TIP SCT (DISPOSABLE) ×1 IMPLANT
SPONGE GAUZE 4X4 12PLY (GAUZE/BANDAGES/DRESSINGS) ×2 IMPLANT
STAPLER VISISTAT 35W (STAPLE) ×2 IMPLANT
SUCTION FRAZIER TIP 10 FR DISP (SUCTIONS) ×2 IMPLANT
SUT BONE WAX W31G (SUTURE) ×2 IMPLANT
SUT VIC AB 0 CTB1 27 (SUTURE) ×4 IMPLANT
SUT VIC AB 1 CT1 27 (SUTURE) ×2
SUT VIC AB 1 CT1 27XBRD ANBCTR (SUTURE) ×2 IMPLANT
SUT VIC AB 2-0 CT1 27 (SUTURE) ×2
SUT VIC AB 2-0 CT1 TAPERPNT 27 (SUTURE) ×2 IMPLANT
SYR CONTROL 10ML LL (SYRINGE) ×2 IMPLANT
TOWEL OR 17X24 6PK STRL BLUE (TOWEL DISPOSABLE) ×2 IMPLANT
TOWEL OR 17X26 10 PK STRL BLUE (TOWEL DISPOSABLE) ×2 IMPLANT
TRAY FOLEY CATH 16FRSI W/METER (SET/KITS/TRAYS/PACK) IMPLANT
WATER STERILE IRR 1000ML POUR (IV SOLUTION) ×4 IMPLANT

## 2013-05-18 NOTE — Transfer of Care (Signed)
Immediate Anesthesia Transfer of Care Note  Patient: Chad Gordon  Procedure(s) Performed: Procedure(s): TOTAL KNEE ARTHROPLASTY (Right)  Patient Location: PACU  Anesthesia Type:GA combined with regional for post-op pain  Level of Consciousness: awake, alert  and oriented  Airway & Oxygen Therapy: Patient Spontanous Breathing and Patient connected to nasal cannula oxygen  Post-op Assessment: Report given to PACU RN, Post -op Vital signs reviewed and stable and Patient moving all extremities X 4  Post vital signs: Reviewed and stable  Complications: No apparent anesthesia complications

## 2013-05-18 NOTE — Anesthesia Postprocedure Evaluation (Signed)
  Anesthesia Post-op Note  Patient: Chad Gordon  Procedure(s) Performed: Procedure(s): TOTAL KNEE ARTHROPLASTY (Right)  Patient Location: PACU  Anesthesia Type: general with adductor canal block  Level of Consciousness: awake, alert  and oriented  Airway and Oxygen Therapy: Patient Spontanous Breathing and Patient connected to nasal cannula oxygen  Post-op Pain: mild  Post-op Assessment: Post-op Vital signs reviewed, Patient's Cardiovascular Status Stable, Respiratory Function Stable, Patent Airway and Pain level controlled  Post-op Vital Signs: stable  Complications: No apparent anesthesia complications

## 2013-05-18 NOTE — H&P (Signed)
Chad Gordon MRN:  [id number] DOB/SEX:  25-Jan-1946/male  CHIEF COMPLAINT:  Painful right Knee  HISTORY: Patient is a 67 y.o. male presented with a history of pain in the right knee. Onset of symptoms was gradual starting several years ago with gradually worsening course since that time. Prior procedures on the knee include arthroscopy. Patient has been treated conservatively with over-the-counter NSAIDs and activity modification. Patient currently rates pain in the knee at 10 out of 10 with activity. There is pain at night.  PAST MEDICAL HISTORY: Patient Active Problem List   Diagnosis Date Noted  . Chronic rhinitis 11/17/2012  . SOB (shortness of breath) 10/06/2012  . HBP (high blood pressure) 10/06/2012   Past Medical History  Diagnosis Date  . Hyperlipidemia     takes Pravastatin nightly  . Hypertension     takes Azor daily  . History of migraine     ocular and last one a yr ago  . Arthritis   . Joint pain   . Joint swelling   . GERD (gastroesophageal reflux disease)     takes Nexium daily   Past Surgical History  Procedure Laterality Date  . Right knee arthroscopy    . Colonoscopy       MEDICATIONS:   Prescriptions prior to admission  Medication Sig Dispense Refill  . amLODipine-olmesartan (AZOR) 5-20 MG per tablet Take 1 tablet by mouth daily.  30 tablet  11  . aspirin EC 81 MG tablet Take 81 mg by mouth every other day.      . celecoxib (CELEBREX) 200 MG capsule Take 200 mg by mouth daily after supper.      . esomeprazole (NEXIUM) 40 MG capsule Take 40 mg by mouth daily.      Marland Kitchen ibuprofen (ADVIL,MOTRIN) 200 MG tablet Take 600 mg by mouth daily as needed for pain.      . pravastatin (PRAVACHOL) 40 MG tablet Take 40 mg by mouth at bedtime.         ALLERGIES:  No Known Allergies  REVIEW OF SYSTEMS:  Pertinent items are noted in HPI.   FAMILY HISTORY:   Family History  Problem Relation Age of Onset  . Clotting disorder Mother     SOCIAL HISTORY:    History  Substance Use Topics  . Smoking status: Former Smoker -- 1.00 packs/day for 40 years    Types: Cigarettes  . Smokeless tobacco: Never Used     Comment: quit 3 1/84yrs ago  . Alcohol Use: 0.0 oz/week     Comment: 1 drink every other day     EXAMINATION:  Vital signs in last 24 hours:    General appearance: alert, cooperative and no distress Lungs: clear to auscultation bilaterally Heart: regular rate and rhythm, S1, S2 normal, no murmur, click, rub or gallop Abdomen: soft, non-tender; bowel sounds normal; no masses,  no organomegaly Extremities: extremities normal, atraumatic, no cyanosis or edema and Homans sign is negative, no sign of DVT Pulses: 2+ and symmetric Skin: Skin color, texture, turgor normal. No rashes or lesions Neurologic: Alert and oriented X 3, normal strength and tone. Normal symmetric reflexes. Normal coordination and gait  Musculoskeletal:  ROM 0-120, Ligaments INTACT,  Imaging Review Plain radiographs demonstrate severe degenerative joint disease of the right knee. The overall alignment is mild valgus. The bone quality appears to be good for age and reported activity level.  Assessment/Plan: End stage arthritis, right knee   The patient history, physical examination and imaging studies are consistent with  advanced degenerative joint disease of the right knee. The patient has failed conservative treatment.  The clearance notes were reviewed.  After discussion with the patient it was felt that Total Knee Replacement was indicated. The procedure,  risks, and benefits of total knee arthroplasty were presented and reviewed. The risks including but not limited to aseptic loosening, infection, blood clots, vascular injury, stiffness, patella tracking problems complications among others were discussed. The patient acknowledged the explanation, agreed to proceed with the plan.  Chad Gordon 05/18/2013, 6:40 AM

## 2013-05-18 NOTE — Anesthesia Preprocedure Evaluation (Addendum)
Anesthesia Evaluation  Patient identified by MRN, date of birth, ID band Patient awake    Reviewed: Allergy & Precautions, H&P , NPO status , Patient's Chart, lab work & pertinent test results, reviewed documented beta blocker date and time   Airway Mallampati: II TM Distance: >3 FB Neck ROM: Full    Dental  (+) Edentulous Upper, Missing, Partial Lower and Dental Advisory Given   Pulmonary shortness of breath, former smoker,          Cardiovascular hypertension,     Neuro/Psych    GI/Hepatic GERD-  ,  Endo/Other    Renal/GU      Musculoskeletal   Abdominal   Peds  Hematology   Anesthesia Other Findings   Reproductive/Obstetrics                           Anesthesia Physical Anesthesia Plan  ASA: II  Anesthesia Plan:    Post-op Pain Management:    Induction:   Airway Management Planned:   Additional Equipment:   Intra-op Plan:   Post-operative Plan:   Informed Consent:   Plan Discussed with:   Anesthesia Plan Comments:         Anesthesia Quick Evaluation

## 2013-05-18 NOTE — Progress Notes (Signed)
Merry Lofty., CRNA at bedside for block.

## 2013-05-18 NOTE — Plan of Care (Signed)
Problem: Consults Goal: Diagnosis- Total Joint Replacement Primary Total Knee Right     

## 2013-05-18 NOTE — Progress Notes (Signed)
Orthopedic Tech Progress Note Patient Details:  Chad Gordon 1945/11/29 [id number]  CPM Right Knee CPM Right Knee: On Right Knee Flexion (Degrees): 90 Right Knee Extension (Degrees): 0 Additional Comments: foot roll   Cammer, Mickie Bail 05/18/2013, 12:15 PM

## 2013-05-18 NOTE — Anesthesia Procedure Notes (Signed)
Anesthesia Regional Block:  Adductor canal block  Pre-Anesthetic Checklist: ,, timeout performed, Correct Patient, Correct Site, Correct Laterality, Correct Procedure, Correct Position, site marked, Risks and benefits discussed,  Surgical consent,  Pre-op evaluation,  At surgeon's request and post-op pain management  Laterality: Right  Prep: chloraprep       Needles:  Injection technique: Single-shot  Needle Type: Echogenic Stimulator Needle     Needle Length:cm 9 cm Needle Gauge: 22 and 22 G    Additional Needles: Adductor canal block Narrative:  Start time: 05/18/2013 8:30 AM End time: 05/18/2013 8:35 AM Injection made incrementally with aspirations every 5 mL.  Performed by: Personally   Additional Notes: 30 cc 0.5% marcaine with 1:200 Epi and 4 mg decadron injected easily.

## 2013-05-18 NOTE — Progress Notes (Signed)
UR COMPLETED  

## 2013-05-18 NOTE — Evaluation (Signed)
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  Patient will benefit from skilled PT to increase their independence and safety with mobility to allow discharge to the venue listed below.       PT Assessment  Patient needs continued PT services    Follow Up Recommendations  Home health PT;Supervision/Assistance - 24 hour    Does the patient have the potential to tolerate intense rehabilitation      Barriers to Discharge        Equipment Recommendations  3in1 (PT);Other (comment) (Bilateral platform RW)    Recommendations for Other Services OT consult   Frequency 7X/week    Precautions / Restrictions Precautions Precautions: Knee Restrictions Weight Bearing Restrictions: Yes RLE Weight Bearing: Weight bearing as tolerated   Pertinent Vitals/Pain 4/10 R knee pain; patient repositioned for comfort and optimal knee ext      Mobility  Bed Mobility Bed Mobility: Supine to Sit;Sit to Supine Supine to Sit: 5: Supervision Sit to Supine: 5: Supervision Details for Bed Mobility Assistance: Overall smooth motion Transfers Transfers: Sit to Stand;Stand to Sit Sit to Stand: 4: Min assist;From bed Stand to Sit: 4: Min assist;To chair/3-in-1 Details for Transfer Assistance: Min assist to steady bil platform RW as pt initiated sit to stand with elbows on platforms; Cues for tench[place] Ambulation/Gait Ambulation/Gait Assistance: 4: Min Environmental consultant (Feet): 5 Feet Assistive device: Bilateral  platform walker Ambulation/Gait Assistance Details: Cues for gait sequence adn to activate R quad for stance stability; Noted some buckling, but pt able to steady himself on platforms Gait Pattern: Step-to pattern    Exercises     PT Diagnosis: Difficulty walking;Acute pain  PT Problem List: Decreased strength;Decreased range of motion;Decreased activity tolerance;Decreased balance;Decreased mobility;Decreased coordination;Decreased knowledge of use of DME;Pain PT Treatment Interventions: DME instruction;Gait training;Stair training;Functional mobility training;Therapeutic activities;Therapeutic exercise;Patient/family education     PT Goals(Current goals can be found in the care plan section) Acute Rehab PT Goals Patient Stated Goal: back to golf PT Goal Formulation: With patient Time For Goal Achievement: 05/25/13 Potential to Achieve Goals: Good  Visit Information  Last PT Received On: 05/18/13 Assistance Needed: +1 History of Present Illness: s/p RTKA       Prior Functioning  Home Living Family/patient expects to be discharged to:: Private residence Living Arrangements: Spouse/significant other Available Help at Discharge: Family;Available PRN/intermittently;Available 24 hours/day Type of Home: House Home Access: Stairs to enter [employer] of Steps: 3 Entrance Stairs-Rails: None Home Layout: One level Home Equipment: None Prior Function Level of Independence: Independent Communication Communication: No difficulties    Cognition  Cognition Arousal/Alertness: Awake/alert Behavior During Therapy: WFL for tasks assessed/performed Overall Cognitive Status: Within Functional Limits for tasks assessed    Extremity/Trunk Assessment Upper Extremity Assessment Upper Extremity Assessment: Defer to OT evaluation (foream amputations happened  early childhood) Lower Extremity Assessment Lower Extremity Assessment: RLE deficits/detail RLE Deficits / Details: Decr  strength, knee control postop; quad activation present; minimal quad lag with straight leg raise   Balance    End of Session PT - End of Session Equipment  Utilized During Treatment: Gait belt Activity Tolerance: Patient tolerated treatment well Patient left: in chair;with call bell/phone within reach Nurse Communication: Mobility status CPM Right Knee CPM Right Knee: Off Right Knee Flexion (Degrees): 90 Right Knee Extension (Degrees): 0 Additional Comments: completed 2 hours 15 min  GP     Van Clines [facility] [place], [place] [phone]  05/18/2013, 4:49 PM

## 2013-05-19 LAB — BASIC METABOLIC PANEL
BUN: 16 mg/dL (ref 6–23)
Chloride: 102 mEq/L (ref 96–112)
GFR calc Af Amer: >90 mL/min (ref >90)
GFR calc non Af Amer: 84 mL/min — ABNORMAL LOW (ref >90)
Potassium: 4.2 mEq/L (ref 3.5–5.1)
Sodium: 138 mEq/L (ref 135–145)

## 2013-05-19 LAB — CBC
HCT: 35.4 % — ABNORMAL LOW (ref 39.0–52.0)
Hemoglobin: 12.1 g/dL — ABNORMAL LOW (ref 13.0–17.0)
RDW: 14.9 % (ref 11.5–15.5)
WBC: 12.9 10*3/uL — ABNORMAL HIGH (ref 4.0–10.5)

## 2013-05-19 MED ORDER — OXYCODONE HCL 5 MG PO TABS: 5.0000 mg | ORAL_TABLET | ORAL | Status: DC | PRN

## 2013-05-19 MED ORDER — METHOCARBAMOL 500 MG PO TABS: 500.0000 mg | ORAL_TABLET | Freq: Four times a day (QID) | ORAL | Status: DC | PRN

## 2013-05-19 MED ORDER — OXYCODONE HCL ER 10 MG PO T12A: 10.0000 mg | EXTENDED_RELEASE_TABLET | Freq: Two times a day (BID) | ORAL | Status: DC

## 2013-05-19 MED ORDER — ENOXAPARIN SODIUM 40 MG/0.4ML ~~LOC~~ SOLN: 40.0000 mg | SUBCUTANEOUS | Status: DC

## 2013-05-19 NOTE — Evaluation (Signed)
Occupational Therapy Evaluation and Discharge Patient Details Name: Chad Gordon MRN: [id number] DOB: 07/10/1946 Today's Date: 05/19/2013 Time: 0943-1000 OT Time Calculation (min): 17 min  OT Assessment / Plan / Recommendation History of present illness s/p RTKA   Clinical Impression   This 67 yo male admitted with above with added issue of Bil mid forearm distal amputations presents to acute OT with all education completed. Acute OT will sign off.    OT Assessment  Patient does not need any further OT services          Equipment Recommendations  3 in 1 bedside comode (already gotten)          Precautions / Restrictions Precautions Precautions: Knee Restrictions Weight Bearing Restrictions: Yes RLE Weight Bearing: Weight bearing as tolerated       ADL  Toilet Transfer: Supervision/safety Toilet Transfer Method: Sit to stand (wife will A by holding PFRW steady with sit<>stand) Toilet Transfer Equipment: Raised toilet seat with arms (or 3-in-1 over toilet) Equipment Used:  (Bil PFRW) Transfers/Ambulation Related to ADLs: S for all with Bil PFRW ADL Comments: Wife will A with LB ADLs prn, pt can manage his UB ADLs post setup mod I due to Bil hand amputations     Acute Rehab OT Goals Patient Stated Goal: home today  Visit Information  Last OT Received On: 05/19/13 Assistance Needed: +1 History of Present Illness: s/p RTKA       Prior Functioning     Home Living Family/patient expects to be discharged to:: Private residence Living Arrangements: Spouse/significant other Available Help at Discharge: Family;Available PRN/intermittently;Available 24 hours/day Type of Home: House Home Access: Stairs to enter [employer] of Steps: 3 Entrance Stairs-Rails: None Home Layout: One level Home Equipment: None Prior Function Level of Independence: Independent Communication Communication: No difficulties Dominant Hand:  (bil traumatic hand amputation at  22 months--train)         Vision/Perception Vision - History Patient Visual Report: No change from baseline   Cognition  Cognition Arousal/Alertness: Awake/alert Behavior During Therapy: WFL for tasks assessed/performed Overall Cognitive Status: Within Functional Limits for tasks assessed    Extremity/Trunk Assessment Upper Extremity Assessment Upper Extremity Assessment: RUE deficits/detail;LUE deficits/detail RUE Deficits / Details: traumatic hand amputation at 22 months--train LUE Deficits / Details: traumatic hand amputation at 22 months--train     Mobility Bed Mobility Bed Mobility: Supine to Sit;Sitting - Scoot to Edge of Bed Supine to Sit: 6: Modified independent (Device/Increase time);HOB elevated Sitting - Scoot to Edge of Bed: 6: Modified independent (Device/Increase time) Transfers Transfers: Sit to Stand;Stand to Sit Sit to Stand: 5: Supervision;Without upper extremity assist;From bed Stand to Sit: 5: Supervision;Without upper extremity assist;To chair/3-in-1 Details for Transfer Assistance: someone to steady the Bil PFRW           End of Session OT - End of Session Equipment Utilized During Treatment:  (Bil PFRW) Activity Tolerance: Patient tolerated treatment well Patient left: in chair;with call bell/phone within reach CPM Right Knee CPM Right Knee: Off Additional Comments: foot roll       Evette Georges [phone] 05/19/2013, 12:03 PM

## 2013-05-19 NOTE — Progress Notes (Signed)
Physical Therapy Treatment Patient Details Name: Chad Gordon MRN: [id number] DOB: [birth year] Today's Date: 05/19/2013 Time: 1035-1100 PT Time Calculation (min): 25 min  PT Assessment / Plan / Recommendation  History of Present Illness s/p RTKA   PT Comments   Making excellent progress with gait, activity tolerance; Stair training complete; Most difficulty with sit to stand from low surfaces; would like to work on this next session with the platform RW that pt will be taking home   Follow Up Recommendations  Home health PT;Supervision/Assistance - 24 hour     Does the patient have the potential to tolerate intense rehabilitation     Barriers to Discharge        Equipment Recommendations  3in1 (PT);Other (comment) (Bil platform RW)    Recommendations for Other Services    Frequency 7X/week   Progress towards PT Goals Progress towards PT goals: Progressing toward goals  Plan Current plan remains appropriate    Precautions / Restrictions Precautions Precautions: Knee Restrictions RLE Weight Bearing: Weight bearing as tolerated   Pertinent Vitals/Pain Pain increased to 7/10 with sit to stand and increased use of R knee; patient repositioned for comfort, and then I returned approx 30 minutes later to place foot in roll    Mobility  Bed Mobility Bed Mobility: Supine to Sit;Sitting - Scoot to Edge of Bed Supine to Sit: 6: Modified independent (Device/Increase time);HOB elevated Sitting - Scoot to Edge of Bed: 6: Modified independent (Device/Increase time) Transfers Transfers: Sit to Stand;Stand to Sit Sit to Stand: 4: Min assist;With upper extremity assist;From chair/3-in-1 (and low surface) Stand to Sit: 5: Supervision;Without upper extremity assist;To chair/3-in-1 Details for Transfer Assistance: someone to steady the Bil PFRW Ambulation/Gait Ambulation/Gait Assistance: 4: Min guard;5: Supervision Ambulation Distance (Feet): 300 Feet Assistive device: Bilateral platform  walker Ambulation/Gait Assistance Details: Cues for R stance stability; noted only 1 instance of buckling, and pt was able to suport self on Platform RWCues for fully upright posture Gait Pattern: Step-through pattern Gait velocity: approaching WNL Stairs: Yes Stairs Assistance: 4: Min assist Stairs Assistance Details (indicate cue type and reason): Verbal and demo cues for technique and sequencing Stair Management Technique: No rails;With walker (bil platform RW) Number of Stairs: 2    Exercises     PT Diagnosis:    PT Problem List:   PT Treatment Interventions:     PT Goals (current goals can now be found in the care plan section) Acute Rehab PT Goals Patient Stated Goal: home today PT Goal Formulation: With patient Time For Goal Achievement: 05/25/13 Potential to Achieve Goals: Good  Visit Information  Last PT Received On: 05/19/13 Assistance Needed: +1 History of Present Illness: s/p RTKA    Subjective Data  Subjective: more painful today; looking forward to going hoem Patient Stated Goal: home today   Cognition  Cognition Arousal/Alertness: Awake/alert Behavior During Therapy: WFL for tasks assessed/performed Overall Cognitive Status: Within Functional Limits for tasks assessed    Balance     End of Session PT - End of Session Equipment Utilized During Treatment: Gait belt Activity Tolerance: Patient tolerated treatment well Patient left: in chair;with call bell/phone within reach Nurse Communication: Mobility status   GP     Olen Pel [place], [place] [phone]  05/19/2013, 3:28 PM

## 2013-05-19 NOTE — Op Note (Signed)
TOTAL KNEE REPLACEMENT OPERATIVE NOTE:  05/18/2013  11:07 AM  PATIENT:  Chad Gordon  67 y.o. male  PRE-OPERATIVE DIAGNOSIS:  osteoarthritis right knee  POST-OPERATIVE DIAGNOSIS:  osteoarthritis right knee  PROCEDURE:  Procedure(s): TOTAL KNEE ARTHROPLASTY  SURGEON:  Surgeon(s): Dannielle Huh, MD  PHYSICIAN ASSISTANT: Altamese Cabal, [facility]  ANESTHESIA:   general  DRAINS: Hemovac and On-Q Marcaine Pain Pump  SPECIMEN: None  COUNTS:  Correct  TOURNIQUET:   Total Tourniquet Time Documented: Thigh (Right) - 52 minutes Total: Thigh (Right) - 52 minutes   DICTATION:  Indication for procedure:    The patient is a 67 y.o. male who has failed conservative treatment for osteoarthritis right knee.  Informed consent was obtained prior to anesthesia. The risks versus benefits of the operation were explain and in a way the patient can, and did, understand.   On the implant demand matching protocol, this patient scored 14.  Therefore, this patient was receive a polyethylene insert with vitamin E which is a high demand implant.  Description of procedure:     The patient was taken to the operating room and placed under anesthesia.  The patient was positioned in the usual fashion taking care that all body parts were adequately padded and/or protected.  I foley catheter was not placed.  A tourniquet was applied and the leg prepped and draped in the usual sterile fashion.  The extremity was exsanguinated with the esmarch and tourniquet inflated to 350 mmHg.  Pre-operative range of motion was normal.  The knee was in 5 degree of mild varus.  A midline incision approximately 6-7 inches long was made with a #10 blade.  A new blade was used to make a parapatellar arthrotomy going 2-3 cm into the quadriceps tendon, over the patella, and alongside the medial aspect of the patellar tendon.  A synovectomy was then performed with the #10 blade and forceps. I then elevated the deep MCL off the medial  tibial metaphysis subperiosteally around to the semimembranosus attachment.    I everted the patella and used calipers to measure patellar thickness.  I used the reamer to ream down to appropriate thickness to recreate the native thickness.  I then removed excess bone with the rongeur and sagittal saw.  I used the appropriately sized template and drilled the three lug holes.  I then put the trial in place and measured the thickness with the calipers to ensure recreation of the native thickness.  The trial was then removed and the patella subluxed and the knee brought into flexion.  A homan retractor was place to retract and protect the patella and lateral structures.  A Z-retractor was place medially to protect the medial structures.  The extra-medullary alignment system was used to make cut the tibial articular surface perpendicular to the anamotic axis of the tibia and in 3 degrees of posterior slope.  The cut surface and alignment jig was removed.  I then used the intramedullary alignment guide to make a 5 valgus cut on the distal femur.  I then marked out the epicondylar axis on the distal femur.  The posterior condylar axis measured 3 degrees.  I then used the anterior referencing sizer and measured the femur to be a size 11.  The 4-In-1 cutting block was screwed into place in external rotation matching the posterior condylar angle, making our cuts perpendicular to the epicondylar axis.  Anterior, posterior and chamfer cuts were made with the sagittal saw.  The cutting block and cut pieces were  removed.  A lamina spreader was placed in 90 degrees of flexion.  The ACL, PCL, menisci, and posterior condylar osteophytes were removed.  A 14 mm spacer blocked was found to offer good flexion and extension gap balance after minimal in degree releasing.   The scoop retractor was then placed and the femoral finishing block was pinned in place.  The small sagittal saw was used as well as the lug drill to finish  the femur.  The block and cut surfaces were removed and the medullary canal hole filled with autograft bone from the cut pieces.  The tibia was delivered forward in deep flexion and external rotation.  A size G tray was selected and pinned into place centered on the medial 1/3 of the tibial tubercle.  The reamer and keel was used to prepare the tibia through the tray.    I then trialed with the size 11 femur, size G tibia, a 14 mm insert and the 38 patella.  I had excellent flexion/extension gap balance, excellent patella tracking.  Flexion was full and beyond 120 degrees; extension was zero.  These components were chosen and the staff opened them to me on the back table while the knee was lavaged copiously and the cement mixed.  The soft tissue was infiltrated with 60cc of exparel 1.3% through a 21 gauge needle.  I cemented in the components and removed all excess cement.  The polyethylene tibial component was snapped into place and the knee placed in extension while cement was hardening.  The capsule was infilltrated with 30cc of .25% Marcaine with epinephrine.  A hemovac was place in the joint exiting superolaterally.  A pain pump was place superomedially superficial to the arthrotomy.  Once the cement was hard, the tourniquet was let down.  Hemostasis was obtained.  The arthrotomy was closed with figure-8 #1 vicryl sutures.  The deep soft tissues were closed with #0 vicryls and the subcuticular layer closed with a running #2-0 vicryl.  The skin was reapproximated and closed with skin staples.  The wound was dressed with xeroform, 4 x4's, 2 ABD sponges, a single layer of webril and a TED stocking.   The patient was then awakened, extubated, and taken to the recovery room in stable condition.  BLOOD LOSS:  300cc DRAINS: 1 hemovac, 1 pain catheter COMPLICATIONS:  None.  PLAN OF CARE: Admit to inpatient   PATIENT DISPOSITION:  PACU - hemodynamically stable.   Delay start of Pharmacological VTE  agent (>24hrs) due to surgical blood loss or risk of bleeding:  not applicable  Please fax a copy of this op note to my office at [phone] (please only include page 1 and 2 of the Case Information op note)

## 2013-05-19 NOTE — Progress Notes (Signed)
Chad Gordon to be D/C'd Home per MD order. Discussed with the patient and all questions fully answered.    Medication List    STOP taking these medications       ibuprofen 200 MG tablet  Commonly known as:  ADVIL,MOTRIN      TAKE these medications       amLODipine-olmesartan 5-20 MG per tablet  Commonly known as:  AZOR  Take 1 tablet by mouth daily.     aspirin EC 81 MG tablet  Take 81 mg by mouth every other day.     celecoxib 200 MG capsule  Commonly known as:  CELEBREX  Take 200 mg by mouth daily after supper.     enoxaparin 40 MG/0.4ML injection  Commonly known as:  LOVENOX  Inject 0.4 mLs (40 mg total) into the skin daily.     esomeprazole 40 MG capsule  Commonly known as:  NEXIUM  Take 40 mg by mouth daily.     methocarbamol 500 MG tablet  Commonly known as:  ROBAXIN  Take 1-2 tablets (500-1,000 mg total) by mouth every 6 (six) hours as needed.     oxyCODONE 5 MG immediate release tablet  Commonly known as:  Oxy IR/ROXICODONE  Take 1-2 tablets (5-10 mg total) by mouth every 3 (three) hours as needed.     OxyCODONE 10 mg T12a 12 hr tablet  Commonly known as:  OXYCONTIN  Take 1 tablet (10 mg total) by mouth every 12 (twelve) hours.     pravastatin 40 MG tablet  Commonly known as:  PRAVACHOL  Take 40 mg by mouth at bedtime.        VVS, Skin clean, dry and intact without evidence of skin break down, no evidence of skin tears noted.  IV catheter discontinued intact. Site without signs and symptoms of complications. Dressing and pressure applied.  An After Visit Summary was printed and given to the patient.  Patient escorted via WC, and D/C home via private auto.  [name]  05/19/2013 7:05 PM

## 2013-05-19 NOTE — Progress Notes (Signed)
Pt unable to void after several attempts, order received to scan and if more than 400 cc to place cath and leave till am.  Scan showed 662 cc, foley placed and 1050 cc obtained.

## 2013-05-19 NOTE — Progress Notes (Signed)
Physical Therapy Note  Excellent progress with transfers and amb; OK for dc from PT standpoint  5/10 pain R knee; patient repositioned for comfort and optimal knee extension   05/19/13 1600  PT Visit Information  Last PT Received On 05/19/13  Assistance Needed +1  History of Present Illness s/p RTKA  PT Time Calculation  PT Start Time 1319  PT Stop Time 1341  PT Time Calculation (min) 22 min  Subjective Data  Subjective looking forward to going hoem  Patient Stated Goal home today  Precautions  Precautions Knee  Restrictions  RLE Weight Bearing WBAT  Cognition  Arousal/Alertness Awake/alert  Behavior During Therapy WFL for tasks assessed/performed  Overall Cognitive Status Within Functional Limits for tasks assessed  Transfers  Transfers Sit to Stand;Stand to Sit  Sit to Stand 4: Min assist;With upper extremity assist;From chair/3-in-1 (and low surface)  Stand to Sit 5: Supervision;Without upper extremity assist;To chair/3-in-1  Details for Transfer Assistance Took most of session to work on sit to stand -- more success with pt heavily leaning left (almost turning left) and pushing off left armrest  Ambulation/Gait  Ambulation/Gait Assistance 5: Supervision  Ambulation Distance (Feet) 300 Feet  Assistive device Bilateral platform walker  Ambulation/Gait Assistance Details Excellent progress; no noted buckling; better posture with adjustments to pt's Bil platform RW  Gait Pattern Step-through pattern  Gait velocity approaching WNL  Exercises  Exercises Total Joint  Total Joint Exercises  Quad Sets AROM;Right;10 reps  Straight Leg Raises AROM;Right;5 reps  Long Arc Quad AROM;Right;10 reps  Knee Flexion AROM;Right;10 reps;Seated  PT - End of Session  Activity Tolerance Patient tolerated treatment well  Patient left in chair;with call bell/phone within reach  Nurse Communication Mobility status  PT - Assessment/Plan  PT Plan Current plan remains appropriate  PT  Frequency 7X/week  Follow Up Recommendations Home health PT;Supervision/Assistance - 24 hour  PT equipment 3in1 (PT);Other (comment) (Bil platform RW; already delivered to room)  PT Goal Progression  Progress towards PT goals Progressing toward goals  Acute Rehab PT Goals  PT Goal Formulation With patient  Time For Goal Achievement 05/25/13  Potential to Achieve Goals Good  PT General Charges  $$ ACUTE PT VISIT 1 Procedure  PT Treatments  $Therapeutic Activity 8-22 mins   [place], [place] [phone]

## 2013-05-19 NOTE — Progress Notes (Signed)
05/19/2013  1000 Darlyne Russian RN, [place]  [phone] Prior to admission MD office arranged home health PT with Genevieve Norlander and [facility] for CPM and [facility]/ [facility] called with referral for RW with bilateral platforms.

## 2013-05-19 NOTE — Progress Notes (Signed)
SPORTS MEDICINE AND JOINT REPLACEMENT  Georgena Spurling, MD   Altamese Cabal, PA-C [address] [place], [place], [place]  [address]                             [phone]   PROGRESS NOTE  Subjective:  negative for Chest Pain  negative for Shortness of Breath  negative for Nausea/Vomiting   negative for Calf Pain  negative for Bowel Movement   Tolerating Diet: yes         Patient reports pain as 5 on 0-10 scale.    Objective: Vital signs in last 24 hours:   Patient Vitals for the past 24 hrs:  BP Temp Temp src Pulse Resp SpO2  05/19/13 0800 - - - - 18 99 %  05/19/13 0530 117/59 mmHg 98 F (36.7 C) Oral 87 18 98 %  05/19/13 0132 121/61 mmHg 98.2 F (36.8 C) Oral 79 18 98 %  05/18/13 2048 116/73 mmHg 98.6 F (37 C) Oral 87 18 98 %    @flow {1959:LAST@   Intake/Output from previous day:   10/13 0701 - 10/14 0700 In: 3510 [P.O.:600; I.V.:2910] Out: 3175 [Urine:2600; Drains:575]   Intake/Output this shift:       Intake/Output     10/13 0701 - 10/14 0700 10/14 0701 - 10/15 0700   P.O. 600    I.V. 2910    Total Intake 3510     Urine 2600    Drains 575    Total Output 3175     Net +335             LABORATORY DATA:  Recent Labs  05/18/13 1450 05/19/13 0540  WBC 13.3* 12.9*  HGB 13.4 12.1*  HCT 40.1 35.4*  PLT 368 352    Recent Labs  05/18/13 1450 05/19/13 0540  NA  --  138  K  --  4.2  CL  --  102  CO2  --  26  BUN  --  16  CREATININE 0.96 0.94  GLUCOSE  --  112*  CALCIUM  --  8.9   Lab Results  Component Value Date   INR 1.02 05/12/2013    Examination:  General appearance: alert, cooperative and no distress Extremities: extremities normal, atraumatic, no cyanosis or edema and Homans sign is negative, no sign of DVT  Wound Exam: clean, dry, intact   Drainage:  None: wound tissue dry  Motor Exam: EHL and FHL Intact  Sensory Exam: Deep Peroneal normal   Assessment:    1 Day Post-Op  Procedure(s) (LRB): TOTAL KNEE ARTHROPLASTY  (Right)  ADDITIONAL DIAGNOSIS:  Active Problems:   * No active hospital problems. *  Acute Blood Loss Anemia   Plan: Physical Therapy as ordered Weight Bearing as Tolerated (WBAT)  DVT Prophylaxis:  Lovenox  DISCHARGE PLAN: Home  DISCHARGE NEEDS: HHPT, CPM, Walker and 3-in-1 comode seat         Leticia Mcdiarmid 05/19/2013, 2:21 PM

## 2013-05-20 ENCOUNTER — Encounter (HOSPITAL_COMMUNITY): Payer: Self-pay | Admitting: Orthopedic Surgery

## 2013-05-20 LAB — URINE CULTURE: Culture: NO GROWTH

## 2013-05-22 NOTE — Discharge Summary (Signed)
SPORTS MEDICINE & JOINT REPLACEMENT   Georgena Spurling, MD   Altamese Cabal, PA-C [address] [place], [place], [place]  [address]                             [phone]  PATIENT ID: Chad Gordon        MRN:  [id number]          DOB/AGE: Jul 06, 1946 / 67 y.o.    DISCHARGE SUMMARY  ADMISSION DATE:    05/18/2013 DISCHARGE DATE:   05/19/2013  ADMISSION DIAGNOSIS: osteoarthritis right knee    DISCHARGE DIAGNOSIS:  osteoarthritis right knee    ADDITIONAL DIAGNOSIS: Active Problems:   * No active hospital problems. *  Past Medical History  Diagnosis Date  . Hyperlipidemia     takes Pravastatin nightly  . Hypertension     takes Azor daily  . History of migraine     ocular and last one a yr ago  . Arthritis   . Joint pain   . Joint swelling   . GERD (gastroesophageal reflux disease)     takes Nexium daily    PROCEDURE: Procedure(s): TOTAL KNEE ARTHROPLASTY on 05/18/2013  CONSULTS:     HISTORY:  See H&P in chart  HOSPITAL COURSE:  Chad Gordon is a 67 y.o. admitted on 05/18/2013 and found to have a diagnosis of osteoarthritis right knee.  After appropriate laboratory studies were obtained  they were taken to the operating room on 05/18/2013 and underwent Procedure(s): TOTAL KNEE ARTHROPLASTY.   They were given perioperative antibiotics:  Anti-infectives   Start     Dose/Rate Route Frequency Ordered Stop   05/18/13 1400  ceFAZolin (ANCEF) IVPB 2 g/50 mL premix     2 g 100 mL/hr over 30 Minutes Intravenous Every 6 hours 05/18/13 1243 05/19/13 0159   05/18/13 0600  ceFAZolin (ANCEF) 3 g in dextrose 5 % 50 mL IVPB     3 g 160 mL/hr over 30 Minutes Intravenous On call to O.R. 05/17/13 1355 05/18/13 0910    .  Tolerated the procedure well.  Placed with a foley intraoperatively.  Given Ofirmev at induction and for 48 hours.    POD# 1: Vital signs were stable.  Patient denied Chest pain, shortness of breath, or calf pain.  Patient was started on Lovenox 30 mg  subcutaneously twice daily at 8am.  Consults to PT, OT, and care management were made.  The patient was weight bearing as tolerated.  CPM was placed on the operative leg 0-90 degrees for 6-8 hours a day.  Incentive spirometry was taught.  Dressing was changed.  Hemovac were discontinued.     Continued  PT for ambulation and exercise program.  IV saline locked.  O2 discontinued.    The remainder of the hospital course was dedicated to ambulation and strengthening.   The patient was discharged on 1 day post-op in  Good condition.  Blood products given:none  DIAGNOSTIC STUDIES: Recent vital signs: No data found.      Recent laboratory studies:  Recent Labs  05/18/13 1450 05/19/13 0540  WBC 13.3* 12.9*  HGB 13.4 12.1*  HCT 40.1 35.4*  PLT 368 352    Recent Labs  05/18/13 1450 05/19/13 0540  NA  --  138  K  --  4.2  CL  --  102  CO2  --  26  BUN  --  16  CREATININE 0.96 0.94  GLUCOSE  --  112*  CALCIUM  --  8.9   Lab Results  Component Value Date   INR 1.02 05/12/2013     Recent Radiographic Studies :  Dg Chest 2 View  05/18/2013   CLINICAL DATA:  Pre operative respiratory EXAM. Osteoarthritis of the right knee.  EXAM: CHEST  2 VIEW  COMPARISON:  None.  FINDINGS: The heart size and mediastinal contours are within normal limits. Both lungs are clear. The visualized skeletal structures are unremarkable.  IMPRESSION: No active cardiopulmonary disease.   Electronically Signed   By: Geanie Cooley M.D.   On: 05/18/2013 07:17    DISCHARGE INSTRUCTIONS: Discharge Orders   Future Orders Complete By Expires   Call MD / Call 911  As directed    Comments:     If you experience chest pain or shortness of breath, CALL 911 and be transported to the hospital emergency room.  If you develope a fever above 101 F, pus (white drainage) or increased drainage or redness at the wound, or calf pain, call your surgeon's office.   Change dressing  As directed    Comments:     Change dressing  on wednesday, then change the dressing daily with sterile 4 x 4 inch gauze dressing and apply TED hose.   Constipation Prevention  As directed    Comments:     Drink plenty of fluids.  Prune juice may be helpful.  You may use a stool softener, such as Colace (over the counter) 100 mg twice a day.  Use MiraLax (over the counter) for constipation as needed.   CPM  As directed    Comments:     Continuous passive motion machine (CPM):      Use the CPM from 0 to 90 for 6-8 hours per day.      You may increase by 10 per day.  You may break it up into 2 or 3 sessions per day.      Use CPM for 2 weeks or until you are told to stop.   Diet - low sodium heart healthy  As directed    Do not put a pillow under the knee. Place it under the heel.  As directed    Driving restrictions  As directed    Comments:     No driving for 6 weeks   Increase activity slowly as tolerated  As directed    Lifting restrictions  As directed    Comments:     No lifting for 6 weeks   TED hose  As directed    Comments:     Use stockings (TED hose) for 3 weeks on both leg(s).  You may remove them at night for sleeping.      DISCHARGE MEDICATIONS:     Medication List    STOP taking these medications       ibuprofen 200 MG tablet  Commonly known as:  ADVIL,MOTRIN      TAKE these medications       amLODipine-olmesartan 5-20 MG per tablet  Commonly known as:  AZOR  Take 1 tablet by mouth daily.     aspirin EC 81 MG tablet  Take 81 mg by mouth every other day.     celecoxib 200 MG capsule  Commonly known as:  CELEBREX  Take 200 mg by mouth daily after supper.     enoxaparin 40 MG/0.4ML injection  Commonly known as:  LOVENOX  Inject 0.4 mLs (40 mg total) into the skin daily.  esomeprazole 40 MG capsule  Commonly known as:  NEXIUM  Take 40 mg by mouth daily.     methocarbamol 500 MG tablet  Commonly known as:  ROBAXIN  Take 1-2 tablets (500-1,000 mg total) by mouth every 6 (six) hours as needed.      oxyCODONE 5 MG immediate release tablet  Commonly known as:  Oxy IR/ROXICODONE  Take 1-2 tablets (5-10 mg total) by mouth every 3 (three) hours as needed.     OxyCODONE 10 mg T12a 12 hr tablet  Commonly known as:  OXYCONTIN  Take 1 tablet (10 mg total) by mouth every 12 (twelve) hours.     pravastatin 40 MG tablet  Commonly known as:  PRAVACHOL  Take 40 mg by mouth at bedtime.        FOLLOW UP VISIT:       Follow-up Information   Follow up with Raymon Mutton, MD. Call on 06/02/2013.   Specialty:  Orthopedic Surgery   Contact information:   [address] [place] [address] [phone]       DISPOSITION: HOME   CONDITION:  Good   Nyiah Pianka 05/22/2013, 8:41 AM

## 2013-09-07 ENCOUNTER — Other Ambulatory Visit: Payer: Self-pay | Admitting: Orthopedic Surgery

## 2013-09-14 ENCOUNTER — Encounter (HOSPITAL_COMMUNITY): Payer: Self-pay | Admitting: Pharmacy Technician

## 2013-09-17 NOTE — Pre-Procedure Instructions (Signed)
Chad Gordon  09/17/2013   Your procedure is scheduled on:  Monday February 23 rd at 0845 AM  Report to [facility] Short Stay Main Entrance "A" at 0645 AM.  Call this number if you have problems the morning of surgery: [phone]   Remember:   Do not eat food or drink liquids after midnight Sunday   Take these medicines the morning of surgery with A SIP OF WATER: Nexium,and Oxycodone if needed for pain Stop Aspirin, Vitamins and Herbal medications on 09-19-13   Do not wear jewelry.  Do not wear lotions, powders, or cologne. You may wear deodorant.  Men may shave face and neck.  Do not bring valuables to the hospital.  [facility] is not responsible for any belongings or valuables.               Contacts, dentures or bridgework may not be worn into surgery.  Leave suitcase in the car. After surgery it may be brought to your room.  For patients admitted to the hospital, discharge time is determined by your treatment team.               Patients discharged the day of surgery will not be allowed to drive home.    Special Instructions: [facility] - Preparing for Surgery  Before surgery, you can play an important role.  Because skin is not sterile, your skin needs to be as free of germs as possible.  You can reduce the number of germs on you skin by washing with CHG (chlorahexidine gluconate) soap before surgery.  CHG is an antiseptic cleaner which kills germs and bonds with the skin to continue killing germs even after washing.  Please DO NOT use if you have an allergy to CHG or antibacterial soaps.  If your skin becomes reddened/irritated stop using the CHG and inform your nurse when you arrive at Short Stay.  Do not shave (including legs and underarms) for at least 48 hours prior to the first CHG shower.  You may shave your face.  Please follow these instructions carefully:   1.  Shower with CHG Soap the night before surgery and the morning of Surgery.  2.  If you choose to wash  your hair, wash your hair first as usual with your normal shampoo.  3.  After you shampoo, rinse your hair and body thoroughly to remove the Shampoo.  4.  Use CHG as you would any other liquid soap.  You can apply chg directly  to the skin and wash gently with scrungie or a clean washcloth.  5.  Apply the CHG Soap to your body ONLY FROM THE NECK DOWN.   Do not use on open wounds or open sores.  Avoid contact with your eyes,       ears, mouth and genitals (private parts).  Wash genitals (private parts) with your normal soap.  6.  Wash thoroughly, paying special attention to the area where your surgery will be performed.  7.  Thoroughly rinse your body with warm water from the neck down.  8.  DO NOT shower/wash with your normal soap after using and rinsing off  the CHG Soap.  9.  Pat yourself dry with a clean towel.            10.  Wear clean pajamas.            11 .  Place clean sheets on your bed the night of your first shower and do not sleep  with pets.  Day of Surgery  Do not apply any lotions/deoderants the morning of surgery.  Please wear clean clothes to the hospital/surgery center.      Please read over the following fact sheets that you were given: Pain Booklet, Coughing and Deep Breathing, Blood Transfusion Information, MRSA Information and Surgical Site Infection Prevention

## 2013-09-18 ENCOUNTER — Encounter (HOSPITAL_COMMUNITY): Payer: Self-pay

## 2013-09-18 ENCOUNTER — Encounter (HOSPITAL_COMMUNITY)
Admission: RE | Admit: 2013-09-18 | Discharge: 2013-09-18 | Disposition: A | Discharge status: Home / Self Care | Payer: Medicare Other | Source: Ambulatory Visit | Attending: Orthopedic Surgery | Admitting: Orthopedic Surgery

## 2013-09-18 DIAGNOSIS — Z01812 Encounter for preprocedural laboratory examination: Secondary | ICD-10-CM | POA: Insufficient documentation

## 2013-09-18 LAB — COMPREHENSIVE METABOLIC PANEL
ALBUMIN: 3.8 g/dL (ref 3.5–5.2)
ALK PHOS: 96 U/L (ref 39–117)
ALT: 25 U/L (ref 0–53)
AST: 20 U/L (ref 0–37)
BILIRUBIN TOTAL: 0.5 mg/dL (ref 0.3–1.2)
BUN: 20 mg/dL (ref 6–23)
CHLORIDE: 102 meq/L (ref 96–112)
CO2: 23 mEq/L (ref 19–32)
Calcium: 8.8 mg/dL (ref 8.4–10.5)
Creatinine, Ser: 0.89 mg/dL (ref 0.50–1.35)
GFR calc non Af Amer: 85 mL/min — ABNORMAL LOW (ref >90)
GLUCOSE: 97 mg/dL (ref 70–99)
POTASSIUM: 4.1 meq/L (ref 3.7–5.3)
Sodium: 138 mEq/L (ref 137–147)
Total Protein: 6.9 g/dL (ref 6.0–8.3)

## 2013-09-18 LAB — CBC WITH DIFFERENTIAL/PLATELET
Basophils Absolute: 0.1 10*3/uL (ref 0.0–0.1)
Basophils Relative: 1 % (ref 0–1)
Eosinophils Absolute: 0.2 10*3/uL (ref 0.0–0.7)
Eosinophils Relative: 3 % (ref 0–5)
HCT: 41.7 % (ref 39.0–52.0)
HEMOGLOBIN: 14.5 g/dL (ref 13.0–17.0)
LYMPHS ABS: 1.6 10*3/uL (ref 0.7–4.0)
Lymphocytes Relative: 24 % (ref 12–46)
MCH: 28.5 pg (ref 26.0–34.0)
MCHC: 34.8 g/dL (ref 30.0–36.0)
MCV: 81.9 fL (ref 78.0–100.0)
MONOS PCT: 11 % (ref 3–12)
Monocytes Absolute: 0.7 10*3/uL (ref 0.1–1.0)
NEUTROS ABS: 3.9 10*3/uL (ref 1.7–7.7)
NEUTROS PCT: 61 % (ref 43–77)
PLATELETS: 387 10*3/uL (ref 150–400)
RBC: 5.09 MIL/uL (ref 4.22–5.81)
RDW: 14.6 % (ref 11.5–15.5)
WBC: 6.4 10*3/uL (ref 4.0–10.5)

## 2013-09-18 LAB — TYPE AND SCREEN
ABO/RH(D): A NEG
Antibody Screen: NEGATIVE

## 2013-09-18 LAB — APTT: APTT: 35 s (ref 24–37)

## 2013-09-18 LAB — PROTIME-INR
INR: 1.09 (ref 0.00–1.49)
Prothrombin Time: 13.9 seconds (ref 11.6–15.2)

## 2013-09-18 LAB — SURGICAL PCR SCREEN
MRSA, PCR: NEGATIVE
STAPHYLOCOCCUS AUREUS: NEGATIVE

## 2013-09-18 NOTE — Progress Notes (Signed)
Patient unable to void at PAT appointment. Stated would bring urine specimen day of surgery.

## 2013-09-21 ENCOUNTER — Other Ambulatory Visit: Payer: Self-pay | Admitting: Orthopedic Surgery

## 2013-09-27 MED ORDER — CEFAZOLIN SODIUM-DEXTROSE 2-3 GM-% IV SOLR
2.0000 g | INTRAVENOUS | Status: DC
  Filled 2013-09-27: qty 50

## 2013-09-27 MED ORDER — TRANEXAMIC ACID 100 MG/ML IV SOLN
1000.0000 mg | INTRAVENOUS | Status: AC
  Administered 2013-09-28: 1000 mg via INTRAVENOUS
  Filled 2013-09-27 (×2): qty 10

## 2013-09-28 ENCOUNTER — Inpatient Hospital Stay (HOSPITAL_COMMUNITY)
Admission: RE | Admit: 2013-09-28 | Discharge: 2013-09-29 | DRG: 470 | Disposition: A | Discharge status: Home Health | Payer: Medicare Other | Source: Ambulatory Visit | Attending: Orthopedic Surgery | Admitting: Orthopedic Surgery

## 2013-09-28 ENCOUNTER — Encounter (HOSPITAL_COMMUNITY): Payer: Medicare Other | Admitting: Anesthesiology

## 2013-09-28 ENCOUNTER — Encounter (HOSPITAL_COMMUNITY)
Admission: RE | Disposition: A | Discharge status: Home Health | Payer: Self-pay | Source: Ambulatory Visit | Attending: Orthopedic Surgery

## 2013-09-28 ENCOUNTER — Inpatient Hospital Stay (HOSPITAL_COMMUNITY): Payer: Medicare Other | Admitting: Anesthesiology

## 2013-09-28 ENCOUNTER — Encounter (HOSPITAL_COMMUNITY): Payer: Self-pay | Admitting: Anesthesiology

## 2013-09-28 DIAGNOSIS — Z96659 Presence of unspecified artificial knee joint: Secondary | ICD-10-CM

## 2013-09-28 DIAGNOSIS — E785 Hyperlipidemia, unspecified: Secondary | ICD-10-CM | POA: Diagnosis present

## 2013-09-28 DIAGNOSIS — Z01812 Encounter for preprocedural laboratory examination: Secondary | ICD-10-CM

## 2013-09-28 DIAGNOSIS — I10 Essential (primary) hypertension: Secondary | ICD-10-CM | POA: Diagnosis present

## 2013-09-28 DIAGNOSIS — K219 Gastro-esophageal reflux disease without esophagitis: Secondary | ICD-10-CM | POA: Diagnosis present

## 2013-09-28 DIAGNOSIS — G43809 Other migraine, not intractable, without status migrainosus: Secondary | ICD-10-CM | POA: Diagnosis present

## 2013-09-28 DIAGNOSIS — D62 Acute posthemorrhagic anemia: Secondary | ICD-10-CM | POA: Diagnosis not present

## 2013-09-28 DIAGNOSIS — M1710 Unilateral primary osteoarthritis, unspecified knee: Principal | ICD-10-CM | POA: Diagnosis present

## 2013-09-28 DIAGNOSIS — J310 Chronic rhinitis: Secondary | ICD-10-CM | POA: Diagnosis present

## 2013-09-28 DIAGNOSIS — Z87891 Personal history of nicotine dependence: Secondary | ICD-10-CM

## 2013-09-28 HISTORY — DX: Presence of unspecified artificial knee joint: Z96.659

## 2013-09-28 HISTORY — DX: Migraine with aura, not intractable, without status migrainosus: G43.109

## 2013-09-28 HISTORY — PX: TOTAL KNEE ARTHROPLASTY: SHX125

## 2013-09-28 LAB — URINALYSIS, ROUTINE W REFLEX MICROSCOPIC
Bilirubin Urine: NEGATIVE
Glucose, UA: NEGATIVE mg/dL
Hgb urine dipstick: NEGATIVE
Ketones, ur: NEGATIVE mg/dL
Leukocytes, UA: NEGATIVE
NITRITE: NEGATIVE
Protein, ur: NEGATIVE mg/dL
SPECIFIC GRAVITY, URINE: 1.018 (ref 1.005–1.030)
Urobilinogen, UA: 0.2 mg/dL (ref 0.0–1.0)
pH: 6.5 (ref 5.0–8.0)

## 2013-09-28 LAB — CBC
HCT: 37.9 % — ABNORMAL LOW (ref 39.0–52.0)
Hemoglobin: 12.7 g/dL — ABNORMAL LOW (ref 13.0–17.0)
MCH: 27.5 pg (ref 26.0–34.0)
MCHC: 33.5 g/dL (ref 30.0–36.0)
MCV: 82 fL (ref 78.0–100.0)
PLATELETS: 352 10*3/uL (ref 150–400)
RBC: 4.62 MIL/uL (ref 4.22–5.81)
RDW: 15.1 % (ref 11.5–15.5)
WBC: 13.1 10*3/uL — ABNORMAL HIGH (ref 4.0–10.5)

## 2013-09-28 LAB — CREATININE, SERUM
Creatinine, Ser: 1.01 mg/dL (ref 0.50–1.35)
GFR calc non Af Amer: 74 mL/min — ABNORMAL LOW (ref >90)
GFR, EST AFRICAN AMERICAN: 86 mL/min — AB (ref >90)

## 2013-09-28 SURGERY — ARTHROPLASTY, KNEE, TOTAL
Anesthesia: Regional | Site: Knee | Laterality: Left

## 2013-09-28 MED ORDER — LACTATED RINGERS IV SOLN
INTRAVENOUS | Status: DC
  Administered 2013-09-28: 08:00:00 via INTRAVENOUS

## 2013-09-28 MED ORDER — CHLORHEXIDINE GLUCONATE 4 % EX LIQD: 60.0000 mL | Freq: Once | CUTANEOUS | Status: DC

## 2013-09-28 MED ORDER — MENTHOL 3 MG MT LOZG: 1.0000 | LOZENGE | OROMUCOSAL | Status: DC | PRN

## 2013-09-28 MED ORDER — CEFAZOLIN SODIUM-DEXTROSE 2-3 GM-% IV SOLR
INTRAVENOUS | Status: DC | PRN
Start: 2013-09-28 — End: 2013-09-28
  Administered 2013-09-28: 2 g via INTRAVENOUS

## 2013-09-28 MED ORDER — METHOCARBAMOL 100 MG/ML IJ SOLN
500.0000 mg | Freq: Four times a day (QID) | INTRAVENOUS | Status: DC | PRN
  Filled 2013-09-28: qty 5

## 2013-09-28 MED ORDER — ROCURONIUM BROMIDE 50 MG/5ML IV SOLN
INTRAVENOUS | Status: AC
  Filled 2013-09-28: qty 1

## 2013-09-28 MED ORDER — METOPROLOL TARTRATE 1 MG/ML IV SOLN
INTRAVENOUS | Status: AC
Start: 2013-09-28 — End: 2013-09-28
  Filled 2013-09-28: qty 5

## 2013-09-28 MED ORDER — SODIUM CHLORIDE 0.9 % IR SOLN
Status: DC | PRN
  Administered 2013-09-28: 1000 mL

## 2013-09-28 MED ORDER — METOCLOPRAMIDE HCL 5 MG PO TABS
5.0000 mg | ORAL_TABLET | Freq: Three times a day (TID) | ORAL | Status: DC | PRN
  Filled 2013-09-28: qty 2

## 2013-09-28 MED ORDER — ALUM & MAG HYDROXIDE-SIMETH 200-200-20 MG/5ML PO SUSP: 30.0000 mL | ORAL | Status: DC | PRN

## 2013-09-28 MED ORDER — PROMETHAZINE HCL 25 MG/ML IJ SOLN: 6.2500 mg | INTRAMUSCULAR | Status: DC | PRN

## 2013-09-28 MED ORDER — ONDANSETRON HCL 4 MG/2ML IJ SOLN: 4.0000 mg | Freq: Four times a day (QID) | INTRAMUSCULAR | Status: DC | PRN

## 2013-09-28 MED ORDER — PROPOFOL 10 MG/ML IV BOLUS
INTRAVENOUS | Status: AC
  Filled 2013-09-28: qty 20

## 2013-09-28 MED ORDER — ENOXAPARIN SODIUM 30 MG/0.3ML ~~LOC~~ SOLN
30.0000 mg | Freq: Two times a day (BID) | SUBCUTANEOUS | Status: DC
  Administered 2013-09-29: 30 mg via SUBCUTANEOUS
  Filled 2013-09-28 (×3): qty 0.3

## 2013-09-28 MED ORDER — CELECOXIB 200 MG PO CAPS
200.0000 mg | ORAL_CAPSULE | Freq: Two times a day (BID) | ORAL | Status: DC
  Administered 2013-09-28 – 2013-09-29 (×2): 200 mg via ORAL
  Filled 2013-09-28 (×3): qty 1

## 2013-09-28 MED ORDER — AMLODIPINE BESYLATE 5 MG PO TABS
5.0000 mg | ORAL_TABLET | Freq: Every day | ORAL | Status: DC
  Administered 2013-09-29: 5 mg via ORAL
  Filled 2013-09-28: qty 1

## 2013-09-28 MED ORDER — PHENOL 1.4 % MT LIQD: 1.0000 | OROMUCOSAL | Status: DC | PRN

## 2013-09-28 MED ORDER — LIDOCAINE HCL (CARDIAC) 20 MG/ML IV SOLN
INTRAVENOUS | Status: AC
  Filled 2013-09-28: qty 5

## 2013-09-28 MED ORDER — METOCLOPRAMIDE HCL 5 MG/ML IJ SOLN: 5.0000 mg | Freq: Three times a day (TID) | INTRAMUSCULAR | Status: DC | PRN

## 2013-09-28 MED ORDER — IRBESARTAN 150 MG PO TABS
150.0000 mg | ORAL_TABLET | Freq: Every day | ORAL | Status: DC
  Administered 2013-09-29: 150 mg via ORAL
  Filled 2013-09-28: qty 1

## 2013-09-28 MED ORDER — PROPOFOL 10 MG/ML IV BOLUS
INTRAVENOUS | Status: DC | PRN
  Administered 2013-09-28: 150 mg via INTRAVENOUS

## 2013-09-28 MED ORDER — HYDROMORPHONE HCL PF 1 MG/ML IJ SOLN
INTRAMUSCULAR | Status: AC
  Administered 2013-09-28: 0.5 mg via INTRAVENOUS
  Filled 2013-09-28: qty 1

## 2013-09-28 MED ORDER — ONDANSETRON HCL 4 MG PO TABS
4.0000 mg | ORAL_TABLET | Freq: Four times a day (QID) | ORAL | Status: DC | PRN
  Administered 2013-09-29: 4 mg via ORAL
  Filled 2013-09-28: qty 1

## 2013-09-28 MED ORDER — BUPIVACAINE LIPOSOME 1.3 % IJ SUSP
20.0000 mL | Freq: Once | INTRAMUSCULAR | Status: DC
  Filled 2013-09-28 (×2): qty 20

## 2013-09-28 MED ORDER — GLYCOPYRROLATE 0.2 MG/ML IJ SOLN
INTRAMUSCULAR | Status: DC | PRN
  Administered 2013-09-28: 0.6 mg via INTRAVENOUS

## 2013-09-28 MED ORDER — SCOPOLAMINE 1 MG/3DAYS TD PT72
MEDICATED_PATCH | TRANSDERMAL | Status: DC | PRN
  Administered 2013-09-28: 1 via TRANSDERMAL

## 2013-09-28 MED ORDER — PANTOPRAZOLE SODIUM 40 MG PO TBEC
80.0000 mg | DELAYED_RELEASE_TABLET | Freq: Every day | ORAL | Status: DC
  Administered 2013-09-29: 80 mg via ORAL
  Filled 2013-09-28: qty 2

## 2013-09-28 MED ORDER — FENTANYL CITRATE 0.05 MG/ML IJ SOLN
INTRAMUSCULAR | Status: DC | PRN
  Administered 2013-09-28 (×5): 50 ug via INTRAVENOUS

## 2013-09-28 MED ORDER — OXYCODONE HCL 5 MG PO TABS
ORAL_TABLET | ORAL | Status: AC
  Administered 2013-09-29: 5 mg via ORAL
  Filled 2013-09-28: qty 2

## 2013-09-28 MED ORDER — 0.9 % SODIUM CHLORIDE (POUR BTL) OPTIME
TOPICAL | Status: DC | PRN
  Administered 2013-09-28: 1000 mL

## 2013-09-28 MED ORDER — MIDAZOLAM HCL 5 MG/5ML IJ SOLN
INTRAMUSCULAR | Status: DC | PRN
  Administered 2013-09-28: 2 mg via INTRAVENOUS

## 2013-09-28 MED ORDER — SODIUM CHLORIDE 0.9 % IV SOLN: INTRAVENOUS | Status: DC

## 2013-09-28 MED ORDER — LACTATED RINGERS IV SOLN
INTRAVENOUS | Status: DC | PRN
  Administered 2013-09-28 (×2): via INTRAVENOUS

## 2013-09-28 MED ORDER — NEOSTIGMINE METHYLSULFATE 1 MG/ML IJ SOLN
INTRAMUSCULAR | Status: DC | PRN
  Administered 2013-09-28: 3 mg via INTRAVENOUS

## 2013-09-28 MED ORDER — BUPIVACAINE LIPOSOME 1.3 % IJ SUSP
INTRAMUSCULAR | Status: DC | PRN
  Administered 2013-09-28: 20 mL

## 2013-09-28 MED ORDER — SENNOSIDES-DOCUSATE SODIUM 8.6-50 MG PO TABS: 1.0000 | ORAL_TABLET | Freq: Every evening | ORAL | Status: DC | PRN

## 2013-09-28 MED ORDER — SCOPOLAMINE 1 MG/3DAYS TD PT72
MEDICATED_PATCH | TRANSDERMAL | Status: AC
  Filled 2013-09-28: qty 1

## 2013-09-28 MED ORDER — EPHEDRINE SULFATE 50 MG/ML IJ SOLN
INTRAMUSCULAR | Status: AC
  Filled 2013-09-28: qty 1

## 2013-09-28 MED ORDER — FLEET ENEMA 7-19 GM/118ML RE ENEM: 1.0000 | ENEMA | Freq: Once | RECTAL | Status: AC | PRN

## 2013-09-28 MED ORDER — AMLODIPINE-OLMESARTAN 5-20 MG PO TABS: 1.0000 | ORAL_TABLET | Freq: Every day | ORAL | Status: DC

## 2013-09-28 MED ORDER — PHENYLEPHRINE 40 MCG/ML (10ML) SYRINGE FOR IV PUSH (FOR BLOOD PRESSURE SUPPORT)
PREFILLED_SYRINGE | INTRAVENOUS | Status: AC
  Filled 2013-09-28: qty 10

## 2013-09-28 MED ORDER — METHOCARBAMOL 500 MG PO TABS
500.0000 mg | ORAL_TABLET | Freq: Four times a day (QID) | ORAL | Status: DC | PRN
  Administered 2013-09-29: 500 mg via ORAL
  Filled 2013-09-28 (×2): qty 1

## 2013-09-28 MED ORDER — GLYCOPYRROLATE 0.2 MG/ML IJ SOLN
INTRAMUSCULAR | Status: AC
  Filled 2013-09-28: qty 3

## 2013-09-28 MED ORDER — BUPIVACAINE-EPINEPHRINE (PF) 0.5% -1:200000 IJ SOLN
INTRAMUSCULAR | Status: AC
  Filled 2013-09-28: qty 10

## 2013-09-28 MED ORDER — ONDANSETRON HCL 4 MG/2ML IJ SOLN
INTRAMUSCULAR | Status: AC
  Filled 2013-09-28: qty 2

## 2013-09-28 MED ORDER — ROCURONIUM BROMIDE 100 MG/10ML IV SOLN
INTRAVENOUS | Status: DC | PRN
  Administered 2013-09-28: 40 mg via INTRAVENOUS

## 2013-09-28 MED ORDER — HYDROMORPHONE HCL PF 1 MG/ML IJ SOLN
0.2500 mg | INTRAMUSCULAR | Status: DC | PRN
  Administered 2013-09-28 (×4): 0.5 mg via INTRAVENOUS

## 2013-09-28 MED ORDER — NEOSTIGMINE METHYLSULFATE 1 MG/ML IJ SOLN
INTRAMUSCULAR | Status: AC
  Filled 2013-09-28: qty 10

## 2013-09-28 MED ORDER — OXYCODONE HCL ER 10 MG PO T12A
10.0000 mg | EXTENDED_RELEASE_TABLET | Freq: Two times a day (BID) | ORAL | Status: DC
  Administered 2013-09-28 – 2013-09-29 (×2): 10 mg via ORAL
  Filled 2013-09-28 (×2): qty 1

## 2013-09-28 MED ORDER — OXYCODONE HCL 5 MG PO TABS: 5.0000 mg | ORAL_TABLET | Freq: Once | ORAL | Status: DC | PRN

## 2013-09-28 MED ORDER — MIDAZOLAM HCL 2 MG/2ML IJ SOLN
INTRAMUSCULAR | Status: AC
  Filled 2013-09-28: qty 2

## 2013-09-28 MED ORDER — OXYCODONE HCL 5 MG PO TABS
5.0000 mg | ORAL_TABLET | ORAL | Status: DC | PRN
  Administered 2013-09-28: 10 mg via ORAL
  Administered 2013-09-29: 5 mg via ORAL
  Administered 2013-09-29: 10 mg via ORAL
  Filled 2013-09-28 (×2): qty 2

## 2013-09-28 MED ORDER — ONDANSETRON HCL 4 MG/2ML IJ SOLN
INTRAMUSCULAR | Status: DC | PRN
  Administered 2013-09-28: 4 mg via INTRAVENOUS

## 2013-09-28 MED ORDER — SODIUM CHLORIDE 0.9 % IV SOLN
INTRAVENOUS | Status: DC
  Administered 2013-09-28 – 2013-09-29 (×2): via INTRAVENOUS

## 2013-09-28 MED ORDER — SIMVASTATIN 20 MG PO TABS
20.0000 mg | ORAL_TABLET | Freq: Every day | ORAL | Status: DC
  Administered 2013-09-28: 20 mg via ORAL
  Filled 2013-09-28 (×2): qty 1

## 2013-09-28 MED ORDER — OXYCODONE HCL 5 MG/5ML PO SOLN: 5.0000 mg | Freq: Once | ORAL | Status: DC | PRN

## 2013-09-28 MED ORDER — BUPIVACAINE-EPINEPHRINE 0.5% -1:200000 IJ SOLN
INTRAMUSCULAR | Status: DC | PRN
  Administered 2013-09-28: 30 mL

## 2013-09-28 MED ORDER — DIPHENHYDRAMINE HCL 12.5 MG/5ML PO ELIX: 12.5000 mg | ORAL_SOLUTION | ORAL | Status: DC | PRN

## 2013-09-28 MED ORDER — FENTANYL CITRATE 0.05 MG/ML IJ SOLN
INTRAMUSCULAR | Status: AC
  Filled 2013-09-28: qty 5

## 2013-09-28 MED ORDER — LIDOCAINE HCL (CARDIAC) 20 MG/ML IV SOLN
INTRAVENOUS | Status: DC | PRN
  Administered 2013-09-28: 70 mg via INTRAVENOUS

## 2013-09-28 MED ORDER — PHENYLEPHRINE HCL 10 MG/ML IJ SOLN
INTRAMUSCULAR | Status: DC | PRN
  Administered 2013-09-28 (×4): 80 ug via INTRAVENOUS

## 2013-09-28 MED ORDER — CEFAZOLIN SODIUM-DEXTROSE 2-3 GM-% IV SOLR
2.0000 g | Freq: Four times a day (QID) | INTRAVENOUS | Status: AC
  Administered 2013-09-28 (×2): 2 g via INTRAVENOUS
  Filled 2013-09-28 (×3): qty 50

## 2013-09-28 MED ORDER — SUCCINYLCHOLINE CHLORIDE 20 MG/ML IJ SOLN
INTRAMUSCULAR | Status: AC
  Filled 2013-09-28: qty 1

## 2013-09-28 MED ORDER — ACETAMINOPHEN 325 MG PO TABS: 650.0000 mg | ORAL_TABLET | Freq: Four times a day (QID) | ORAL | Status: DC | PRN

## 2013-09-28 MED ORDER — METOPROLOL TARTRATE 1 MG/ML IV SOLN
INTRAVENOUS | Status: DC | PRN
  Administered 2013-09-28 (×2): 2 mg via INTRAVENOUS

## 2013-09-28 MED ORDER — HYDROMORPHONE HCL PF 1 MG/ML IJ SOLN
1.0000 mg | INTRAMUSCULAR | Status: DC | PRN
  Administered 2013-09-29: 1 mg via INTRAVENOUS
  Filled 2013-09-28: qty 1

## 2013-09-28 MED ORDER — ACETAMINOPHEN 650 MG RE SUPP: 650.0000 mg | Freq: Four times a day (QID) | RECTAL | Status: DC | PRN

## 2013-09-28 MED ORDER — ACETAMINOPHEN 10 MG/ML IV SOLN
INTRAVENOUS | Status: DC | PRN
  Administered 2013-09-28: 1000 mg via INTRAVENOUS

## 2013-09-28 MED ORDER — DOCUSATE SODIUM 100 MG PO CAPS
100.0000 mg | ORAL_CAPSULE | Freq: Two times a day (BID) | ORAL | Status: DC
  Administered 2013-09-28 – 2013-09-29 (×2): 100 mg via ORAL
  Filled 2013-09-28 (×4): qty 1

## 2013-09-28 MED ORDER — ACETAMINOPHEN 10 MG/ML IV SOLN
1000.0000 mg | INTRAVENOUS | Status: DC
  Filled 2013-09-28: qty 100

## 2013-09-28 MED ORDER — BISACODYL 5 MG PO TBEC: 5.0000 mg | DELAYED_RELEASE_TABLET | Freq: Every day | ORAL | Status: DC | PRN

## 2013-09-28 SURGICAL SUPPLY — 55 items
BANDAGE ESMARK 6X9 LF (GAUZE/BANDAGES/DRESSINGS) ×1 IMPLANT
BLADE SAGITTAL 13X1.27X60 (BLADE) ×2 IMPLANT
BLADE SAGITTAL 13X1.27X60MM (BLADE) ×1
BLADE SAW SGTL 83.5X18.5 (BLADE) ×3 IMPLANT
BNDG ESMARK 6X9 LF (GAUZE/BANDAGES/DRESSINGS) ×3
BOWL SMART MIX CTS (DISPOSABLE) ×3 IMPLANT
CAP KNEE CMT PERSONA ×3 IMPLANT
CEMENT BONE SIMPLEX SPEEDSET (Cement) ×6 IMPLANT
COVER SURGICAL LIGHT HANDLE (MISCELLANEOUS) ×3 IMPLANT
CUFF TOURNIQUET SINGLE 34IN LL (TOURNIQUET CUFF) ×3 IMPLANT
DRAPE EXTREMITY T 121X128X90 (DRAPE) ×3 IMPLANT
DRAPE INCISE IOBAN 66X45 STRL (DRAPES) ×6 IMPLANT
DRAPE PROXIMA HALF (DRAPES) ×3 IMPLANT
DRAPE U-SHAPE 47X51 STRL (DRAPES) ×3 IMPLANT
DRSG ADAPTIC 3X8 NADH LF (GAUZE/BANDAGES/DRESSINGS) ×3 IMPLANT
DRSG PAD ABDOMINAL 8X10 ST (GAUZE/BANDAGES/DRESSINGS) ×3 IMPLANT
DURAPREP 26ML APPLICATOR (WOUND CARE) ×6 IMPLANT
ELECT REM PT RETURN 9FT ADLT (ELECTROSURGICAL) ×3
ELECTRODE REM PT RTRN 9FT ADLT (ELECTROSURGICAL) ×1 IMPLANT
EVACUATOR 1/8 PVC DRAIN (DRAIN) ×3 IMPLANT
GLOVE BIOGEL M 7.0 STRL (GLOVE) ×3 IMPLANT
GLOVE BIOGEL PI IND STRL 7.5 (GLOVE) ×1 IMPLANT
GLOVE BIOGEL PI IND STRL 8.5 (GLOVE) ×1 IMPLANT
GLOVE BIOGEL PI INDICATOR 7.5 (GLOVE) ×2
GLOVE BIOGEL PI INDICATOR 8.5 (GLOVE) ×2
GLOVE SURG ORTHO 8.0 STRL STRW (GLOVE) ×6 IMPLANT
GOWN PREVENTION PLUS XLARGE (GOWN DISPOSABLE) IMPLANT
GOWN STRL NON-REIN LRG LVL3 (GOWN DISPOSABLE) IMPLANT
GOWN STRL REUS W/ TWL XL LVL3 (GOWN DISPOSABLE) ×3 IMPLANT
GOWN STRL REUS W/TWL XL LVL3 (GOWN DISPOSABLE) ×6
HANDPIECE INTERPULSE COAX TIP (DISPOSABLE) ×2
HOOD PEEL AWAY FACE SHEILD DIS (HOOD) ×9 IMPLANT
KIT BASIN OR (CUSTOM PROCEDURE TRAY) ×3 IMPLANT
KIT ROOM TURNOVER OR (KITS) ×3 IMPLANT
MANIFOLD NEPTUNE II (INSTRUMENTS) ×3 IMPLANT
NEEDLE 22X1 1/2 (OR ONLY) (NEEDLE) ×6 IMPLANT
NS IRRIG 1000ML POUR BTL (IV SOLUTION) ×3 IMPLANT
PACK TOTAL JOINT (CUSTOM PROCEDURE TRAY) ×3 IMPLANT
PAD ARMBOARD 7.5X6 YLW CONV (MISCELLANEOUS) ×6 IMPLANT
PADDING CAST COTTON 6X4 STRL (CAST SUPPLIES) ×3 IMPLANT
SET HNDPC FAN SPRY TIP SCT (DISPOSABLE) ×1 IMPLANT
SPONGE GAUZE 4X4 12PLY (GAUZE/BANDAGES/DRESSINGS) ×3 IMPLANT
STAPLER VISISTAT 35W (STAPLE) ×3 IMPLANT
SUCTION FRAZIER TIP 10 FR DISP (SUCTIONS) ×3 IMPLANT
SUT BONE WAX W31G (SUTURE) ×3 IMPLANT
SUT VIC AB 0 CTB1 27 (SUTURE) ×6 IMPLANT
SUT VIC AB 1 CT1 27 (SUTURE) ×4
SUT VIC AB 1 CT1 27XBRD ANBCTR (SUTURE) ×2 IMPLANT
SUT VIC AB 2-0 CT1 27 (SUTURE) ×4
SUT VIC AB 2-0 CT1 TAPERPNT 27 (SUTURE) ×2 IMPLANT
SYR CONTROL 10ML LL (SYRINGE) ×6 IMPLANT
TOWEL OR 17X24 6PK STRL BLUE (TOWEL DISPOSABLE) ×3 IMPLANT
TOWEL OR 17X26 10 PK STRL BLUE (TOWEL DISPOSABLE) ×3 IMPLANT
TRAY FOLEY CATH 14FR (SET/KITS/TRAYS/PACK) IMPLANT
WATER STERILE IRR 1000ML POUR (IV SOLUTION) IMPLANT

## 2013-09-28 NOTE — Anesthesia Preprocedure Evaluation (Signed)
Anesthesia Evaluation  Patient identified by MRN, date of birth, ID band Patient awake    Reviewed: Allergy & Precautions, H&P , NPO status , Patient's Chart, lab work & pertinent test results, reviewed documented beta blocker date and time   Airway Mallampati: II TM Distance: >3 FB Neck ROM: Full    Dental  (+) Edentulous Upper, Missing, Partial Lower, Dental Advisory Given   Pulmonary shortness of breath, former smoker,  breath sounds clear to auscultation        Cardiovascular hypertension, Rhythm:Regular Rate:Normal     Neuro/Psych    GI/Hepatic GERD-  ,  Endo/Other    Renal/GU      Musculoskeletal   Abdominal   Peds  Hematology   Anesthesia Other Findings   Reproductive/Obstetrics                           Anesthesia Physical Anesthesia Plan  ASA: II  Anesthesia Plan: General   Post-op Pain Management:    Induction: Intravenous  Airway Management Planned: LMA and Oral ETT  Additional Equipment:   Intra-op Plan:   Post-operative Plan: Extubation in OR  Informed Consent: I have reviewed the patients History and Physical, chart, labs and discussed the procedure including the risks, benefits and alternatives for the proposed anesthesia with the patient or authorized representative who has indicated his/her understanding and acceptance.   Dental advisory given  Plan Discussed with: CRNA and Surgeon  Anesthesia Plan Comments:         Anesthesia Quick Evaluation

## 2013-09-28 NOTE — Progress Notes (Signed)
Orthopedic Tech Progress Note Patient Details:  Chad Gordon 05-19-1946 [id number]  Patient ID: Chad Gordon, male   DOB: 05-19-1946, 68 y.o.   MRN: [id number] Placed pt's lle in cpm @ 0-90 degrees @1945   Nikki DomCrawford, Dorian Renfro 09/28/2013, 7:45 PM

## 2013-09-28 NOTE — Plan of Care (Signed)
Problem: Consults Goal: Diagnosis- Total Joint Replacement Primary Total Knee Left     

## 2013-09-28 NOTE — Progress Notes (Signed)
Utilization review completed.  

## 2013-09-28 NOTE — Op Note (Signed)
TOTAL KNEE REPLACEMENT OPERATIVE NOTE:  09/28/2013  3:40 PM  PATIENT:  Chad Gordon  68 y.o. male  PRE-OPERATIVE DIAGNOSIS:  osteoarthritis left knee  POST-OPERATIVE DIAGNOSIS:  osteoarthritis left knee  PROCEDURE:  Procedure(s): TOTAL KNEE ARTHROPLASTY- left  SURGEON:  Surgeon(s): Dannielle HuhSteve Theophilus Walz, MD  PHYSICIAN ASSISTANT: Altamese CabalMaurice Jones, [facility]  ANESTHESIA:   general  DRAINS: Hemovac  SPECIMEN: None  COUNTS:  Correct  TOURNIQUET:   Total Tourniquet Time Documented: Thigh (Left) - 57 minutes Total: Thigh (Left) - 57 minutes   DICTATION:  Indication for procedure:    The patient is a 68 y.o. male who has failed conservative treatment for osteoarthritis left knee.  Informed consent was obtained prior to anesthesia. The risks versus benefits of the operation were explain and in a way the patient can, and did, understand.   On the implant demand matching protocol, this patient scored 10.  Therefore, this patient was not receive a polyethylene insert with vitamin E which is a high demand implant.  Description of procedure:     The patient was taken to the operating room and placed under anesthesia.  The patient was positioned in the usual fashion taking care that all body parts were adequately padded and/or protected.  I foley catheter was not placed.  A tourniquet was applied and the leg prepped and draped in the usual sterile fashion.  The extremity was exsanguinated with the esmarch and tourniquet inflated to 350 mmHg.  Pre-operative range of motion was normal.  The knee was in 5 degree of mild varus.  A midline incision approximately 6-7 inches long was made with a #10 blade.  A new blade was used to make a parapatellar arthrotomy going 2-3 cm into the quadriceps tendon, over the patella, and alongside the medial aspect of the patellar tendon.  A synovectomy was then performed with the #10 blade and forceps. I then elevated the deep MCL off the medial tibial metaphysis  subperiosteally around to the semimembranosus attachment.    I everted the patella and used calipers to measure patellar thickness.  I used the reamer to ream down to appropriate thickness to recreate the native thickness.  I then removed excess bone with the rongeur and sagittal saw.  I used the appropriately sized template and drilled the three lug holes.  I then put the trial in place and measured the thickness with the calipers to ensure recreation of the native thickness.  The trial was then removed and the patella subluxed and the knee brought into flexion.  A homan retractor was place to retract and protect the patella and lateral structures.  A Z-retractor was place medially to protect the medial structures.  The extra-medullary alignment system was used to make cut the tibial articular surface perpendicular to the anamotic axis of the tibia and in 3 degrees of posterior slope.  The cut surface and alignment jig was removed.  I then used the intramedullary alignment guide to make a 6 valgus cut on the distal femur.  I then marked out the epicondylar axis on the distal femur.  The posterior condylar axis measured 3 degrees.  I then used the anterior referencing sizer and measured the femur to be a size 11.  The 4-In-1 cutting block was screwed into place in external rotation matching the posterior condylar angle, making our cuts perpendicular to the epicondylar axis.  Anterior, posterior and chamfer cuts were made with the sagittal saw.  The cutting block and cut pieces were removed.  A  lamina spreader was placed in 90 degrees of flexion.  The ACL, PCL, menisci, and posterior condylar osteophytes were removed.  A 13 mm spacer blocked was found to offer good flexion and extension gap balance after minimal in degree releasing.   The scoop retractor was then placed and the femoral finishing block was pinned in place.  The small sagittal saw was used as well as the lug drill to finish the femur.  The  block and cut surfaces were removed and the medullary canal hole filled with autograft bone from the cut pieces.  The tibia was delivered forward in deep flexion and external rotation.  A size G tray was selected and pinned into place centered on the medial 1/3 of the tibial tubercle.  The reamer and keel was used to prepare the tibia through the tray.    I then trialed with the size 11 femur, size G tibia, a 13 mm insert and the 41 patella.  I had excellent flexion/extension gap balance, excellent patella tracking.  Flexion was full and beyond 120 degrees; extension was zero.  These components were chosen and the staff opened them to me on the back table while the knee was lavaged copiously and the cement mixed.  The soft tissue was infiltrated with 60cc of exparel 1.3% through a 21 gauge needle.  I cemented in the components and removed all excess cement.  The polyethylene tibial component was snapped into place and the knee placed in extension while cement was hardening.  The capsule was infilltrated with 30cc of .25% Marcaine with epinephrine.  A hemovac was place in the joint exiting superolaterally.  A pain pump was place superomedially superficial to the arthrotomy.  Once the cement was hard, the tourniquet was let down.  Hemostasis was obtained.  The arthrotomy was closed with figure-8 #1 vicryl sutures.  The deep soft tissues were closed with #0 vicryls and the subcuticular layer closed with a running #2-0 vicryl.  The skin was reapproximated and closed with skin staples.  The wound was dressed with xeroform, 4 x4's, 2 ABD sponges, a single layer of webril and a TED stocking.   The patient was then awakened, extubated, and taken to the recovery room in stable condition.  BLOOD LOSS:  300cc DRAINS: 1 hemovac, 1 pain catheter COMPLICATIONS:  None.  PLAN OF CARE: Admit to inpatient   PATIENT DISPOSITION:  PACU - hemodynamically stable.   Delay start of Pharmacological VTE agent (>24hrs) due  to surgical blood loss or risk of bleeding:  not applicable  Please fax a copy of this op note to my office at [phone] (please only include page 1 and 2 of the Case Information op note)

## 2013-09-28 NOTE — Progress Notes (Signed)
Orthopedic Tech Progress Note Patient Details:  Lynnette Caffeyhomas Gladman June 18, 1946 [id number] CPM applied to Left LE with appropriate settings. OHF applied to bed. Footsie roll provided.  CPM Left Knee CPM Left Knee: On Left Knee Flexion (Degrees): 90 Left Knee Extension (Degrees): 0   Asia R Thompson 09/28/2013, 11:53 AM

## 2013-09-28 NOTE — H&P (Signed)
  Chad Gordon Current MRN:  [id number] DOB/SEX:  [birth year]/male  CHIEF COMPLAINT:  Painful left Knee  HISTORY: Patient is a 68 y.o. male presented with a history of pain in the left knee. Onset of symptoms was gradual starting several years ago with gradually worsening course since that time. Prior procedures on the knee include none. Patient has been treated conservatively with over-the-counter NSAIDs and activity modification. Patient currently rates pain in the knee at several out of 10 with activity. There is pain at night.  PAST MEDICAL HISTORY: Patient Active Problem List   Diagnosis Date Noted  . Chronic rhinitis 11/17/2012  . SOB (shortness of breath) 10/06/2012  . HBP (high blood pressure) 10/06/2012   Past Medical History  Diagnosis Date  . Hyperlipidemia     takes Pravastatin nightly  . Hypertension     takes Azor daily  . History of migraine     ocular and last one a yr ago  . Arthritis   . Joint pain   . Joint swelling   . GERD (gastroesophageal reflux disease)     takes Nexium daily   Past Surgical History  Procedure Laterality Date  . Right knee arthroscopy    . Colonoscopy    . Total knee arthroplasty Right 05/18/2013    Procedure: TOTAL KNEE ARTHROPLASTY;  Surgeon: Dannielle HuhSteve Lucey, MD;  Location: [facility] OR;  Service: Orthopedics;  Laterality: Right;     MEDICATIONS:   No prescriptions prior to admission    ALLERGIES:  No Known Allergies  REVIEW OF SYSTEMS:  Pertinent items are noted in HPI.   FAMILY HISTORY:   Family History  Problem Relation Age of Onset  . Clotting disorder Mother     SOCIAL HISTORY:   History  Substance Use Topics  . Smoking status: Former Smoker -- 1.00 packs/day for 40 years    Types: Cigarettes  . Smokeless tobacco: Never Used     Comment: quit 4359yrs ago  . Alcohol Use: 0.0 oz/week     Comment: 1 drink every other day     EXAMINATION:  Vital signs in last 24 hours:    General appearance: alert, cooperative and no  distress Lungs: clear to auscultation bilaterally Heart: regular rate and rhythm, S1, S2 normal, no murmur, click, rub or gallop Abdomen: soft, non-tender; bowel sounds normal; no masses,  no organomegaly Extremities: extremities normal, atraumatic, no cyanosis or edema and Homans sign is negative, no sign of DVT Pulses: 2+ and symmetric Skin: Skin color, texture, turgor normal. No rashes or lesions Neurologic: Alert and oriented X 3, normal strength and tone. Normal symmetric reflexes. Normal coordination and gait  Musculoskeletal:  ROM 0-115, Ligaments intact,  Imaging Review Plain radiographs demonstrate severe degenerative joint disease of the left knee. The overall alignment is mild valgus. The bone quality appears to be good for age and reported activity level.  Assessment/Plan: End stage arthritis, left knee   The patient history, physical examination and imaging studies are consistent with advanced degenerative joint disease of the left knee. The patient has failed conservative treatment.  The clearance notes were reviewed.  After discussion with the patient it was felt that Total Knee Replacement was indicated. The procedure,  risks, and benefits of total knee arthroplasty were presented and reviewed. The risks including but not limited to aseptic loosening, infection, blood clots, vascular injury, stiffness, patella tracking problems complications among others were discussed. The patient acknowledged the explanation, agreed to proceed with the plan.  Chad Gordon 09/28/2013, 6:21 AM

## 2013-09-28 NOTE — Preoperative (Signed)
Beta Blockers   Reason not to administer Beta Blockers:Not Applicable 

## 2013-09-28 NOTE — Transfer of Care (Signed)
Immediate Anesthesia Transfer of Care Note  Patient: Chad Gordon  Procedure(s) Performed: Procedure(s): TOTAL KNEE ARTHROPLASTY- left (Left)  Patient Location: PACU  Anesthesia Type:General and Regional  Level of Consciousness: sedated and patient cooperative  Airway & Oxygen Therapy: Patient Spontanous Breathing and Patient connected to face mask oxygen  Post-op Assessment: Report given to PACU RN and Post -op Vital signs reviewed and stable  Post vital signs: Reviewed and stable  Complications: No apparent anesthesia complications

## 2013-09-28 NOTE — Anesthesia Postprocedure Evaluation (Signed)
  Anesthesia Post-op Note  Patient: Chad Gordon  Procedure(s) Performed: Procedure(s): TOTAL KNEE ARTHROPLASTY- left (Left)  Patient Location: PACU  Anesthesia Type:GA combined with regional for post-op pain  Level of Consciousness: awake and alert   Airway and Oxygen Therapy: Patient Spontanous Breathing  Post-op Pain: mild  Post-op Assessment: Post-op Vital signs reviewed  Post-op Vital Signs: stable  Complications: No apparent anesthesia complications

## 2013-09-28 NOTE — Anesthesia Procedure Notes (Signed)
Anesthesia Regional Block:  Adductor canal block  Pre-Anesthetic Checklist: ,, timeout performed, Correct Patient, Correct Site, Correct Laterality, Correct Procedure, Correct Position, site marked, Risks and benefits discussed,  Surgical consent,  Pre-op evaluation,  At surgeon's request and post-op pain management  Laterality: Left and Upper  Prep: chloraprep       Needles:   Needle Type: Echogenic Needle     Needle Length: 9cm 9 cm Needle Gauge: 22 and 22 G  Needle insertion depth: 7 cm   Additional Needles:  Procedures: ultrasound guided (picture in chart) Adductor canal block Narrative:  Start time: 09/28/2013 8:05 AM End time: 09/28/2013 8:35 AM Injection made incrementally with aspirations every 5 mL.  Performed by: Personally  Anesthesiologist: TMassagee  Additional Notes: Tolerated well

## 2013-09-28 NOTE — Evaluation (Signed)
Physical Therapy Evaluation Patient Details Name: Chad Gordon MRN: [id number] DOB: September 19, 1945 Today's Date: 09/28/2013 Time: 7829-5621 PT Time Calculation (min): 30 min  PT Assessment / Plan / Recommendation History of Present Illness  Pt is a 68 y/o male admitted s/p L TKA. Had R TKA done in October 2014. 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   Clinical Impression  This patient presents with acute pain and decreased functional independence following the above mentioned procedure. At the time of PT eval, pt was able to transfer to/from EOB with min assist. Dizziness upon first sitting up but resolved in ~5 minutes. This patient is appropriate for skilled PT interventions to address functional limitations, improve safety and independence with functional mobility, and return to PLOF.     PT Assessment  Patient needs continued PT services    Follow Up Recommendations  Home health PT    Does the patient have the potential to tolerate intense rehabilitation      Barriers to Discharge        Equipment Recommendations  None recommended by PT    Recommendations for Other Services     Frequency 7X/week    Precautions / Restrictions Precautions Precautions: Fall;Knee Precaution Comments: Discussed towel roll under heel and NO pillow under knee. Restrictions Weight Bearing Restrictions: Yes LLE Weight Bearing: Weight bearing as tolerated   Pertinent Vitals/Pain Pt reports no pain on LLE (surgical side), and 2/10 pain in RLE as he continues to have pain from previous surgery.       Mobility  Bed Mobility Overal bed mobility: Needs Assistance Bed Mobility: Supine to Sit;Sit to Supine Supine to sit: Min assist Sit to supine: Min assist General bed mobility comments: VC's for sequencing and technique. Minimal assist required to transition to EOB for LLE movement.  Transfers General transfer comment: NT as double platform walker not available  at this time.     Exercises Total Joint Exercises Ankle Circles/Pumps: 10 reps Quad Sets: 10 reps   PT Diagnosis: Difficulty walking;Acute pain  PT Problem List: Decreased strength;Decreased range of motion;Decreased activity tolerance;Decreased balance;Decreased mobility;Decreased knowledge of use of DME;Decreased safety awareness;Decreased knowledge of precautions;Pain PT Treatment Interventions: DME instruction;Gait training;Stair training;Functional mobility training;Therapeutic activities;Therapeutic exercise;Neuromuscular re-education;Patient/family education     PT Goals(Current goals can be found in the care plan section) Acute Rehab PT Goals Patient Stated Goal: To return home with wife PT Goal Formulation: With patient Time For Goal Achievement: 10/05/13 Potential to Achieve Goals: Good  Visit Information  Last PT Received On: 09/28/13 Assistance Needed: +1 History of Present Illness: Pt is a 68 y/o male admitted s/p L TKA. Had R TKA done in October 2014. 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        Prior Functioning  Home Living Family/patient expects to be discharged to:: Private residence Living Arrangements: Spouse/significant other Available Help at Discharge: Family;Available 24 hours/day Type of Home: House (Townhouse) Home Access: Stairs to enter [employer] of Steps: 2 Entrance Stairs-Rails: Left Home Layout: One level Home Equipment: Walker - 2 wheels;Bedside commode Prior Function Level of Independence: Needs assistance ADL's / Homemaking Assistance Needed: Assist for shoes/socks Communication Communication: No difficulties Dominant Hand: Right    Cognition  Cognition Arousal/Alertness: Awake/alert Behavior During Therapy: WFL for tasks assessed/performed Overall Cognitive Status: Within Functional Limits for tasks assessed    Extremity/Trunk Assessment Upper Extremity Assessment Upper Extremity  Assessment: Defer to OT evaluation Lower Extremity Assessment Lower Extremity Assessment: LLE deficits/detail LLE  Deficits / Details: Decreased strength and AROM consistent with TKA LLE: Unable to fully assess due to pain Cervical / Trunk Assessment Cervical / Trunk Assessment: Normal   Balance Balance Overall balance assessment: No apparent balance deficits (not formally assessed)  End of Session PT - End of Session Activity Tolerance: Patient tolerated treatment well Patient left: in bed;with call bell/phone within reach Nurse Communication: Mobility status CPM Left Knee CPM Left Knee: Off Left Knee Flexion (Degrees): 90 Left Knee Extension (Degrees): 0  GP     Chad CancerHamilton, Chad Gordon 09/28/2013, 5:18 PM  Chad CancerLaura Gordon, PT, DPT [phone]

## 2013-09-29 LAB — URINE CULTURE
COLONY COUNT: NO GROWTH
Culture: NO GROWTH

## 2013-09-29 LAB — BASIC METABOLIC PANEL
BUN: 15 mg/dL (ref 6–23)
CHLORIDE: 99 meq/L (ref 96–112)
CO2: 26 mEq/L (ref 19–32)
Calcium: 8.5 mg/dL (ref 8.4–10.5)
Creatinine, Ser: 1.07 mg/dL (ref 0.50–1.35)
GFR calc Af Amer: 80 mL/min — ABNORMAL LOW (ref >90)
GFR, EST NON AFRICAN AMERICAN: 69 mL/min — AB (ref >90)
GLUCOSE: 108 mg/dL — AB (ref 70–99)
POTASSIUM: 4 meq/L (ref 3.7–5.3)
Sodium: 138 mEq/L (ref 137–147)

## 2013-09-29 LAB — CBC
HEMATOCRIT: 36.9 % — AB (ref 39.0–52.0)
Hemoglobin: 12.5 g/dL — ABNORMAL LOW (ref 13.0–17.0)
MCH: 27.8 pg (ref 26.0–34.0)
MCHC: 33.9 g/dL (ref 30.0–36.0)
MCV: 82 fL (ref 78.0–100.0)
Platelets: 339 10*3/uL (ref 150–400)
RBC: 4.5 MIL/uL (ref 4.22–5.81)
RDW: 15.2 % (ref 11.5–15.5)
WBC: 13.2 10*3/uL — ABNORMAL HIGH (ref 4.0–10.5)

## 2013-09-29 MED ORDER — OXYCODONE HCL 5 MG PO TABS: 5.0000 mg | ORAL_TABLET | ORAL | Status: DC | PRN

## 2013-09-29 MED ORDER — METHOCARBAMOL 500 MG PO TABS: 500.0000 mg | ORAL_TABLET | Freq: Four times a day (QID) | ORAL | Status: DC | PRN

## 2013-09-29 MED ORDER — ENOXAPARIN SODIUM 40 MG/0.4ML ~~LOC~~ SOLN: 40.0000 mg | SUBCUTANEOUS | Status: DC

## 2013-09-29 MED ORDER — OXYCODONE HCL ER 10 MG PO T12A: 10.0000 mg | EXTENDED_RELEASE_TABLET | Freq: Two times a day (BID) | ORAL | Status: DC

## 2013-09-29 NOTE — Discharge Instructions (Signed)
Diet: As you were doing prior to hospitalization   Activity:  Increase activity slowly as tolerated                  No lifting or driving for 6 weeks  Shower:  May shower without a dressing once there is no drainage from your wound.                 Do NOT wash over the wound.                 Dressing:  You may change your dressing on Wednesday                    Then change the dressing daily with sterile 4"x4"s gauze dressing                     And TED hose for knees.  Weight Bearing:  Weight bearing as tolerated as taught in physical therapy.  Use a                                walker or Crutches as instructed.  To prevent constipation: you may use a stool softener such as -               Colace ( over the counter) 100 mg by mouth twice a day                Drink plenty of fluids ( prune juice may be helpful) and high fiber foods                Miralax ( over the counter) for constipation as needed.    Precautions:  If you experience chest pain or shortness of breath - call 911 immediately               For transfer to the hospital emergency department!!               If you develop a fever greater that 101 F, purulent drainage from wound,                             increased redness or drainage from wound, or calf pain -- Call the office.  Follow- Up Appointment:  Please call for an appointment to be seen on 10/13/13                                              [facility] [place] office:  [phone]            [address] [place], [place] [address]                 [facility] [phone]

## 2013-09-29 NOTE — Progress Notes (Signed)
Patient continues to need a minimum of 2 person assist. After having spent 15 minutes in CPM this am. Patient was given muscle relaxant, short and long acting pain medication in preparation for PT. Stood at bedside with PT and patient falls back onto bed yelling he cannot do this. Voided while standing. Patient encouraged to take deep breathes and relax before another attempt to place him in recliner by PT.

## 2013-09-29 NOTE — Care Management Note (Signed)
CARE MANAGEMENT NOTE 09/29/2013  Patient:  Chad Gordon,Chad Gordon   Account Number:  [id number]  Date Initiated:  09/29/2013  Documentation initiated by:  Vance PeperBRADY,Jenell Dobransky  Subjective/Objective Assessment:   68 yr old male s/p left total knee arthroplasty.     Action/Plan:   case manager spoke with patient concerning home health and DME needs at discharge. Patient preoperatively setup with [facility], no changes. Has all his adaptive equipment. CPM to be delivered.   Anticipated DC Date:  09/29/2013   Anticipated DC Plan:  HOME W HOME HEALTH SERVICES      DC Planning Services  CM consult      [facility]  DURABLE MEDICAL EQUIPMENT   Choice offered to / List presented to:  C-1 Patient   DME arranged  CPM      DME agency  [employer]     HH arranged  HH[facility] PT      [facility]   Status of service:  Completed, signed off Medicare Important Message given?   (If response is "NO", the following Medicare IM given date fields will be blank) Date Medicare IM given:   Date Additional Medicare IM given:    Discharge Disposition:  HOME W HOME HEALTH SERVICES

## 2013-09-29 NOTE — Progress Notes (Signed)
Physical Therapy Treatment Patient Details Name: Chad Gordon MRN: [id number] DOB: 1945/09/25 Today's Date: 09/29/2013 Time: 1421-1450 PT Time Calculation (min): 29 min  PT Assessment / Plan / Recommendation  History of Present Illness Pt is a 68 y/o male admitted s/p L TKA. Had R TKA done in October 2014. 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    PT Comments   Pt progressing towards physical therapy goals. Improvement seen with mobility, however pt continues to require cues for safety. Pt states his wife will be assisting him mostly at home, and is confident in negotiating the stairs to enter. Discussed HEP and car transfer. Pt anticipates d/c this evening.   Follow Up Recommendations  Home health PT     Does the patient have the potential to tolerate intense rehabilitation     Barriers to Discharge        Equipment Recommendations  None recommended by PT    Recommendations for Other Services    Frequency 7X/week   Progress towards PT Goals Progress towards PT goals: Progressing toward goals  Plan Current plan remains appropriate    Precautions / Restrictions Precautions Precautions: Fall;Knee Precaution Comments: Discussed towel roll under heel and NO pillow under knee. Restrictions Weight Bearing Restrictions: Yes LLE Weight Bearing: Weight bearing as tolerated   Pertinent Vitals/Pain Pt reports moderate pain throughout session, however appears to be able to tolerate more functional movement compared to AM session.     Mobility  Bed Mobility Overal bed mobility: Needs Assistance Bed Mobility: Supine to Sit Supine to sit: Min assist General bed mobility comments: NT - pt received up in chair Transfers Overall transfer level: Needs assistance Equipment used: Rolling walker (2 wheeled) Transfers: Sit to/from Stand Sit to Stand: Min guard Stand pivot transfers: Min assist;+2 physical assistance;+2 safety/equipment General transfer  comment: Pt able to come to full stand without assist. Close guard +2 for safety, and increased time needed. Ambulation/Gait Ambulation/Gait assistance: Min assist Ambulation Distance (Feet): 50 Feet Assistive device: Rolling walker (2 wheeled) Gait Pattern/deviations: Step-to pattern;Decreased stride length Gait velocity: Decreased Gait velocity interpretation: Below normal speed for age/gender General Gait Details: Occasional assist required for walker placement and steadying pt as he was getting his UE's settled on the platforms of the walker. VC's for sequencing and technique.  Stairs: Yes Stairs assistance: Min assist Stair Management: One rail Left;Sideways;Backwards Number of Stairs: 3 General stair comments: Pt began stair training backwards with walker however turned to the side to complete stair training with BUE's on L railing. Explained safety concerns with this, however pt states this is the way he has been doing it at home, and his wife will be available to help him. VC's for sequencing and safety throughout.     Exercises Total Joint Exercises Ankle Circles/Pumps: 10 reps Quad Sets: 10 reps Heel Slides: 10 reps;Supine Hip ABduction/ADduction: 10 reps Goniometric ROM: 52 AROM   PT Diagnosis:    PT Problem List:   PT Treatment Interventions:     PT Goals (current goals can now be found in the care plan section) Acute Rehab PT Goals Patient Stated Goal: To return home with wife PT Goal Formulation: With patient Time For Goal Achievement: 10/05/13 Potential to Achieve Goals: Good  Visit Information  Last PT Received On: 09/29/13 Assistance Needed: +1 History of Present Illness: Pt is a 68 y/o male admitted s/p L TKA. Had R TKA done in October 2014. 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  Subjective Data  Subjective: "My wife will help me up the stairs." Patient Stated Goal: To return home with wife   Cognition   Cognition Arousal/Alertness: Awake/alert Behavior During Therapy: WFL for tasks assessed/performed Overall Cognitive Status: Within Functional Limits for tasks assessed    Balance  Balance Overall balance assessment: Needs assistance Sitting-balance support: Feet supported;Bilateral upper extremity supported Sitting balance-Leahy Scale: Fair Standing balance support: Bilateral upper extremity supported Standing balance-Leahy Scale: Poor General Comments General comments (skin integrity, edema, etc.): Issued HEP handout and discussed exercises in detail. Practiced a few reps of each.  End of Session PT - End of Session Equipment Utilized During Treatment: Gait belt Activity Tolerance: Patient limited by pain Patient left: in chair;with call bell/phone within reach Nurse Communication: Mobility status   GP     Ruthann Cancer 09/29/2013, 4:14 PM  Ruthann Cancer, PT, DPT [phone]

## 2013-09-29 NOTE — Progress Notes (Signed)
Pt was due to void at 2230 and has been unable to void the entire evening. Pt had multiple unsuccessful attempts at standing up to use the urinal. Pt stated the he felt he could not urinate. First in and out cath, Bladder scan showed 650 ml. In and out cath resulted 900 ml. Pt continued to be diligent about standing up to use the urinal and his multiple attempts were still unsuccessful. Second bladder scan showed 656 ml, in and out cath resulted 1000 ml. Will continue to monitor urine output.

## 2013-09-29 NOTE — Progress Notes (Signed)
Physical Therapy Treatment Patient Details Name: Chad Gordon MRN: [id number] DOB: [birth year] Today's Date: 09/29/2013 Time: 1030-1104 PT Time Calculation (min): 34 min  PT Assessment / Plan / Recommendation  History of Present Illness Pt is a 68 y/o male admitted s/p L TKA. Had R TKA done in October 2014. 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    PT Comments   Pt progressing towards physical therapy goals. Pt with increased pain and anxiety during session today, and was reminded to move slowly through transfers. At times, pt could not tolerate sitting EOB due to the knee flexion involved. Could only tolerate SPT to chair and no further ambulation. At end of session, pt positioned in recliner with legs elevated and towel roll under heel. He states he is comfortable.   Follow Up Recommendations  Home health PT     Does the patient have the potential to tolerate intense rehabilitation     Barriers to Discharge        Equipment Recommendations  None recommended by PT    Recommendations for Other Services    Frequency 7X/week   Progress towards PT Goals Progress towards PT goals: Progressing toward goals  Plan Current plan remains appropriate    Precautions / Restrictions Precautions Precautions: Fall;Knee Precaution Comments: Discussed towel roll under heel and NO pillow under knee. Restrictions Weight Bearing Restrictions: Yes LLE Weight Bearing: Weight bearing as tolerated   Pertinent Vitals/Pain 10/10 at times. 7/10 at rest. RN states he had pain medication staggered throughout morning.    Mobility  Bed Mobility Overal bed mobility: Needs Assistance Bed Mobility: Supine to Sit Supine to sit: Min assist General bed mobility comments: VC's for sequencing and technique. Minimal assist required to transition to EOB for LLE movement.  Transfers Overall transfer level: Needs assistance Equipment used: Rolling walker (2 wheeled) Transfers:  Sit to/from [employer]Stand;Stand Pivot Transfers Sit to Stand: Min assist;+2 physical assistance;+2 safety/equipment Stand pivot transfers: Min assist;+2 physical assistance;+2 safety/equipment General transfer comment: VC's for assist to power-up to full stand. Pt very anxious and somewhat impulsive - +2 assist required for safety. During SPT, pt was able to negotiate with walker well with some assist for walker placement.  Ambulation/Gait General Gait Details: NT as pt with increased pain and could not tolerate more than SPT to chair at this time.     Exercises Total Joint Exercises Ankle Circles/Pumps: 10 reps Quad Sets: 10 reps Heel Slides: 10 reps;Supine Hip ABduction/ADduction: 10 reps Goniometric ROM: 52 AROM   PT Diagnosis:    PT Problem List:   PT Treatment Interventions:     PT Goals (current goals can now be found in the care plan section) Acute Rehab PT Goals Patient Stated Goal: To return home with wife PT Goal Formulation: With patient Time For Goal Achievement: 10/05/13 Potential to Achieve Goals: Good  Visit Information  Last PT Received On: 09/29/13 Assistance Needed: +1 History of Present Illness: Pt is a 68 y/o male admitted s/p L TKA. Had R TKA done in October 2014. 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     Subjective Data  Subjective: "I can't believe how bad this hurts." Patient Stated Goal: To return home with wife   Cognition  Cognition Arousal/Alertness: Awake/alert Behavior During Therapy: WFL for tasks assessed/performed Overall Cognitive Status: Within Functional Limits for tasks assessed    Balance  Balance Overall balance assessment: Needs assistance Sitting-balance support: Feet supported;No upper extremity supported Sitting balance-Leahy Scale: Fair Standing  balance support: Bilateral upper extremity supported Standing balance-Leahy Scale: Poor  End of Session PT - End of Session Equipment Utilized During  Treatment: Gait belt Activity Tolerance: Patient limited by pain Patient left: in chair;with call bell/phone within reach Nurse Communication: Mobility status;Other (comment) (Pt requests acid reflux pill) CPM Left Knee CPM Left Knee: On Left Knee Flexion (Degrees): 60 Left Knee Extension (Degrees): 0   GP     Ruthann Cancer 09/29/2013, 12:26 PM  Ruthann Cancer, PT, DPT [phone]

## 2013-09-29 NOTE — Plan of Care (Signed)
Problem: Phase I Progression Outcomes Goal: Dangle or out of bed evening of surgery Outcome: Progressing OOB this am for the first time, amb to recliner in room

## 2013-09-29 NOTE — Progress Notes (Signed)
SPORTS MEDICINE AND JOINT REPLACEMENT  Georgena SpurlingStephen Lucey, MD   Altamese CabalMaurice Alleyne Lac, PA-C [address], [place], [place]  [address]                             [phone]   PROGRESS NOTE  Subjective:  negative for Chest Pain  negative for Shortness of Breath  negative for Nausea/Vomiting   negative for Calf Pain  negative for Bowel Movement   Tolerating Diet: yes         Patient reports pain as 4 on 0-10 scale.    Objective: Vital signs in last 24 hours:   Patient Vitals for the past 24 hrs:  BP Temp Pulse Resp SpO2  09/29/13 0513 166/87 mmHg 98.9 F (37.2 C) 96 18 94 %  09/28/13 2028 135/80 mmHg 98.1 F (36.7 C) 93 18 100 %  09/28/13 1600 - - - 16 98 %  09/28/13 1533 146/89 mmHg 97.9 F (36.6 C) 84 14 97 %  09/28/13 1515 145/60 mmHg - 87 16 98 %  09/28/13 1500 - - 86 11 99 %  09/28/13 1445 - - 86 11 100 %  09/28/13 1430 144/90 mmHg - 89 17 99 %  09/28/13 1415 - - 83 12 98 %  09/28/13 1400 - - 79 11 78 %  09/28/13 1345 - - 83 14 76 %  09/28/13 1330 124/88 mmHg - 76 12 94 %  09/28/13 1315 - - 79 10 97 %  09/28/13 1300 - - 84 14 97 %  09/28/13 1245 - - 76 11 99 %  09/28/13 1230 141/82 mmHg - 80 13 99 %  09/28/13 1215 133/85 mmHg - 72 10 100 %  09/28/13 1200 145/83 mmHg 98.1 F (36.7 C) 71 11 99 %  09/28/13 1153 - - 70 11 100 %  09/28/13 1145 126/84 mmHg - 74 13 100 %  09/28/13 1130 136/81 mmHg - 72 18 99 %  09/28/13 1115 123/75 mmHg - 74 17 99 %  09/28/13 1100 107/73 mmHg 97.7 F (36.5 C) 59 13 97 %  09/28/13 0831 - - 72 12 98 %  09/28/13 0829 134/79 mmHg - 71 11 98 %  09/28/13 0828 - - 71 11 98 %  09/28/13 0826 - - 73 12 97 %  09/28/13 0824 164/87 mmHg - 83 17 100 %  09/28/13 0823 - - 85 15 100 %  09/28/13 0821 - - - 18 -  09/28/13 0819 172/89 mmHg - - 15 -  09/28/13 0814 168/78 mmHg - - 22 -  09/28/13 0812 - - 73 13 99 %  09/28/13 0809 181/96 mmHg - 76 16 99 %    @flow {1959:LAST@   Intake/Output from previous day:   02/23 0701 - 02/24 0700 In: 2862.5  [P.O.:240; I.V.:2622.5] Out: 2525 [Urine:1900; Drains:625]   Intake/Output this shift:       Intake/Output     02/23 0701 - 02/24 0700 02/24 0701 - 02/25 0700   P.O. 240    I.V. 2622.5    Total Intake 2862.5     Urine 1900    Drains 625    Total Output 2525     Net +337.5             LABORATORY DATA:  Recent Labs  09/28/13 1842 09/29/13 0416  WBC 13.1* 13.2*  HGB 12.7* 12.5*  HCT 37.9* 36.9*  PLT 352 339    Recent Labs  09/28/13 1842 09/29/13 0416  NA  --  138  K  --  4.0  CL  --  99  CO2  --  26  BUN  --  15  CREATININE 1.01 1.07  GLUCOSE  --  108*  CALCIUM  --  8.5   Lab Results  Component Value Date   INR 1.09 09/18/2013   INR 1.02 05/12/2013    Examination:  General appearance: alert, cooperative and no distress Extremities: Homans sign is negative, no sign of DVT  Wound Exam: clean, dry, intact   Drainage:  Scant/small amount Serosanguinous exudate  Motor Exam: EHL and FHL Intact  Sensory Exam: Deep Peroneal normal   Assessment:    1 Day Post-Op  Procedure(s) (LRB): TOTAL KNEE ARTHROPLASTY- left (Left)  ADDITIONAL DIAGNOSIS:  Active Problems:   S/P total knee arthroplasty  Acute Blood Loss Anemia   Plan: Physical Therapy as ordered Weight Bearing as Tolerated (WBAT)  DVT Prophylaxis:  Lovenox  DISCHARGE PLAN: Home  DISCHARGE NEEDS: HHPT, CPM, Walker and 3-in-1 comode seat         Mattthew Ziomek 09/29/2013, 7:41 AM

## 2013-09-29 NOTE — Plan of Care (Signed)
Problem: Phase I Progression Outcomes Goal: Pain controlled with appropriate interventions Outcome: Not Progressing Very poor pain control, utilizing 12 hr release analgesic, muscle relaxant, short acting analgesic, and iv analgesic for breakthrough pain

## 2013-09-29 NOTE — Progress Notes (Signed)
OT Cancellation Note and Discharge  Patient Details Name: Chad Gordon MRN: [id number] DOB: 02/21/1946   Cancelled Treatment:     Spoke with pt, pt's wife helps with all ADLs due to B UE deficits. Acute OT will sign off.   Rae LipsMiller, Quianna Avery M [phone] 09/29/2013, 3:11 PM

## 2013-09-30 ENCOUNTER — Encounter (HOSPITAL_COMMUNITY): Payer: Self-pay | Admitting: Orthopedic Surgery

## 2013-09-30 NOTE — Discharge Summary (Signed)
SPORTS MEDICINE & JOINT REPLACEMENT   Georgena Spurling, MD   Altamese Cabal, PA-C [address] [place], [place], [place]  [address]                             [phone]  PATIENT ID: Chad Gordon        MRN:  [id number]          DOB/AGE: 1945-09-13 / 68 y.o.    DISCHARGE SUMMARY  ADMISSION DATE:    09/28/2013 DISCHARGE DATE: 09/29/2013  ADMISSION DIAGNOSIS: osteoarthritis left knee    DISCHARGE DIAGNOSIS:  osteoarthritis left knee    ADDITIONAL DIAGNOSIS: Active Problems:   S/P total knee arthroplasty  Past Medical History  Diagnosis Date  . Hyperlipidemia     takes Pravastatin nightly  . Hypertension     takes Azor daily  . Joint pain   . Joint swelling   . GERD (gastroesophageal reflux disease)     takes Nexium daily  . Ocular migraine     "couple/year; just had one last week" (09/28/2013)  . Arthritis     "knees; ankles" (09/28/2013)    PROCEDURE: Procedure(s): TOTAL KNEE ARTHROPLASTY- left on 09/28/2013  CONSULTS:     HISTORY:  See H&P in chart  HOSPITAL COURSE:  Nguyen Todorov is a 68 y.o. admitted on 09/28/2013 and found to have a diagnosis of osteoarthritis left knee.  After appropriate laboratory studies were obtained  they were taken to the operating room on 09/28/2013 and underwent Procedure(s): TOTAL KNEE ARTHROPLASTY- left.   They were given perioperative antibiotics:  Anti-infectives   Start     Dose/Rate Route Frequency Ordered Stop   09/28/13 1600  ceFAZolin (ANCEF) IVPB 2 g/50 mL premix     2 g 100 mL/hr over 30 Minutes Intravenous Every 6 hours 09/28/13 1454 09/28/13 2330   09/28/13 0600  ceFAZolin (ANCEF) IVPB 2 g/50 mL premix  Status:  Discontinued     2 g 100 mL/hr over 30 Minutes Intravenous On call to O.R. 09/27/13 1346 09/28/13 1454    .  Tolerated the procedure well.  Placed with a foley intraoperatively.  Given Ofirmev at induction and for 48 hours.    POD# 1: Vital signs were stable.  Patient denied Chest pain, shortness of breath,  or calf pain.  Patient was started on Lovenox 30 mg subcutaneously twice daily at 8am.  Consults to PT, OT, and care management were made.  The patient was weight bearing as tolerated.  CPM was placed on the operative leg 0-90 degrees for 6-8 hours a day.  Incentive spirometry was taught.  Dressing was changed.  Marcaine pump and hemovac were discontinued.      POD #2, Continued  PT for ambulation and exercise program.  IV saline locked.  O2 discontinued.    The remainder of the hospital course was dedicated to ambulation and strengthening.   The patient was discharged on post op day 1 in  Good condition.  Blood products given:none  DIAGNOSTIC STUDIES: Recent vital signs: Patient Vitals for the past 24 hrs:  BP Temp Temp src Pulse Resp  09/29/13 1300 121/72 mmHg 98.9 F (37.2 C) Oral 103 18       Recent laboratory studies:  Recent Labs  09/28/13 1842 09/29/13 0416  WBC 13.1* 13.2*  HGB 12.7* 12.5*  HCT 37.9* 36.9*  PLT 352 339    Recent Labs  09/28/13 1842 09/29/13 0416  NA  --  138  K  --  4.0  CL  --  99  CO2  --  26  BUN  --  15  CREATININE 1.01 1.07  GLUCOSE  --  108*  CALCIUM  --  8.5   Lab Results  Component Value Date   INR 1.09 09/18/2013   INR 1.02 05/12/2013     Recent Radiographic Studies :  No results found.  DISCHARGE INSTRUCTIONS: Discharge Orders   Future Orders Complete By Expires   Call MD / Call 911  As directed    Comments:     If you experience chest pain or shortness of breath, CALL 911 and be transported to the hospital emergency room.  If you develope a fever above 101 F, pus (white drainage) or increased drainage or redness at the wound, or calf pain, call your surgeon's office.   Change dressing  As directed    Comments:     Change dressing on wednesday, then change the dressing daily with sterile 4 x 4 inch gauze dressing and apply TED hose.   Constipation Prevention  As directed    Comments:     Drink plenty of fluids.  Prune  juice may be helpful.  You may use a stool softener, such as Colace (over the counter) 100 mg twice a day.  Use MiraLax (over the counter) for constipation as needed.   CPM  As directed    Comments:     Continuous passive motion machine (CPM):      Use the CPM from 0 to 90 for 6-8 hours per day.      You may increase by 10 per day.  You may break it up into 2 or 3 sessions per day.      Use CPM for 2 weeks or until you are told to stop.   Diet - low sodium heart healthy  As directed    Do not put a pillow under the knee. Place it under the heel.  As directed    Driving restrictions  As directed    Comments:     No driving for 6 weeks   Increase activity slowly as tolerated  As directed    Lifting restrictions  As directed    Comments:     No lifting for 6 weeks   TED hose  As directed    Comments:     Use stockings (TED hose) for 3 weeks on both leg(s).  You may remove them at night for sleeping.      DISCHARGE MEDICATIONS:     Medication List    STOP taking these medications       aspirin EC 81 MG tablet      TAKE these medications       amLODipine-olmesartan 5-20 MG per tablet  Commonly known as:  AZOR  Take 1 tablet by mouth daily.     celecoxib 200 MG capsule  Commonly known as:  CELEBREX  Take 200 mg by mouth daily.     enoxaparin 40 MG/0.4ML injection  Commonly known as:  LOVENOX  Inject 0.4 mLs (40 mg total) into the skin daily.     esomeprazole 40 MG capsule  Commonly known as:  NEXIUM  Take 40 mg by mouth daily.     methocarbamol 500 MG tablet  Commonly known as:  ROBAXIN  Take 1-2 tablets (500-1,000 mg total) by mouth every 6 (six) hours as needed for muscle spasms.     oxyCODONE 5 MG  immediate release tablet  Commonly known as:  Oxy IR/ROXICODONE  Take 1-2 tablets (5-10 mg total) by mouth every 3 (three) hours as needed for breakthrough pain.     OxyCODONE 10 mg T12a 12 hr tablet  Commonly known as:  OXYCONTIN  Take 1 tablet (10 mg total) by mouth  every 12 (twelve) hours.     pravastatin 40 MG tablet  Commonly known as:  PRAVACHOL  Take 40 mg by mouth at bedtime.        FOLLOW UP VISIT:       Follow-up Information   Follow up with Raymon Mutton, MD. Call on 10/13/2013.   Specialty:  Orthopedic Surgery   Contact information:   [address] [place] [address] [phone]       DISPOSITION: HOME   CONDITION:  Good   Kaylani Fromme 09/30/2013, 12:49 PM

## 2015-02-20 IMAGING — CT CT PARANASAL SINUSES LIMITED
1 of 2 series · 16 of 19 positions shown, 20 images · non-contrast
Comparison: None.

CLINICAL DATA: Chronic rhinitis.

CT PARANASAL SINUS LIMITED WITHOUT CONTRAST
TECHNIQUE: Multidetector CT images of the paranasal sinuses were
obtained in a single plane without contrast.

[Series 4: ltd sinus 3.0 h30s · axial · 0.36mm/px · z∈[-80,+17]mm · 16 of 18 slices shown, 20 images]
[im 2/18  brain]
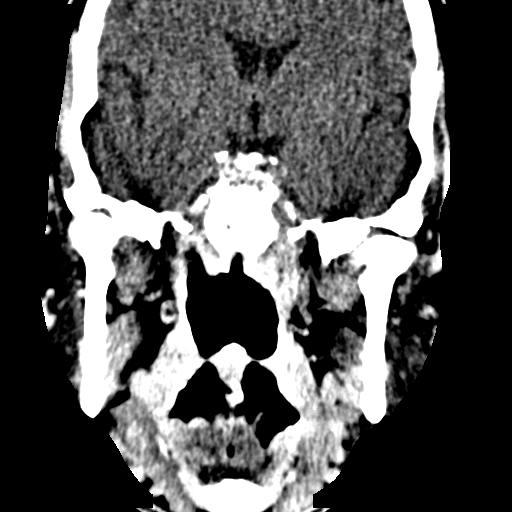
[im 2/18  bone]
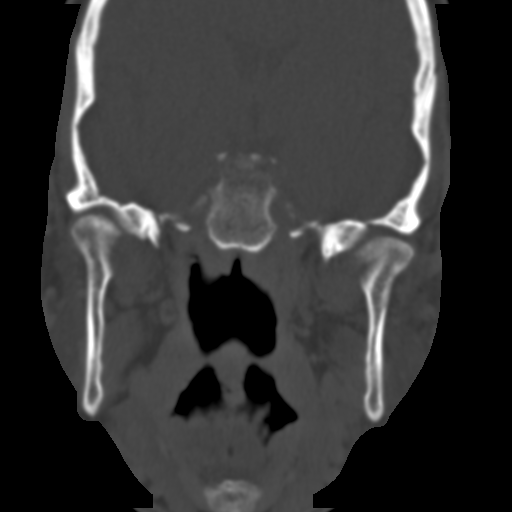
[im 3/18  bone]
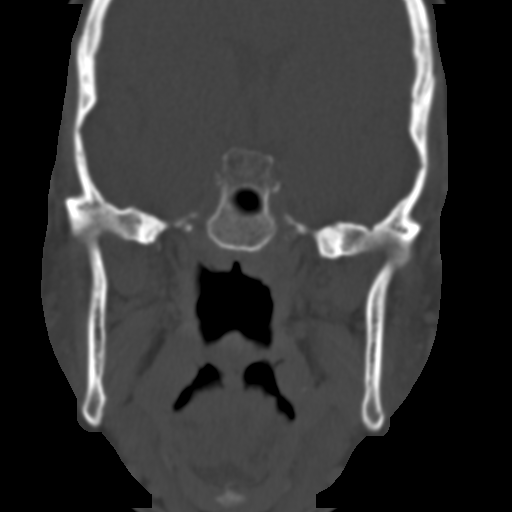
[im 4/18  bone]
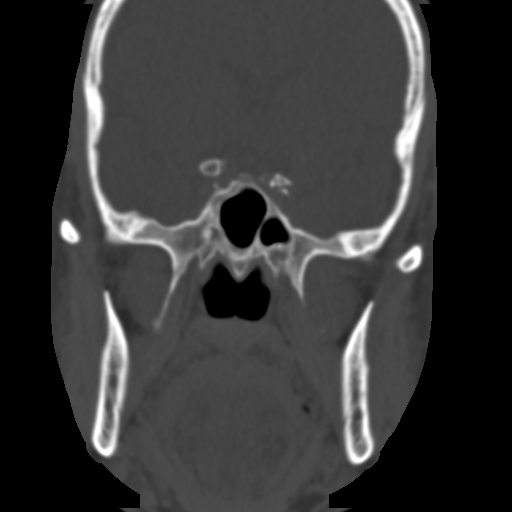
[im 5/18  bone]
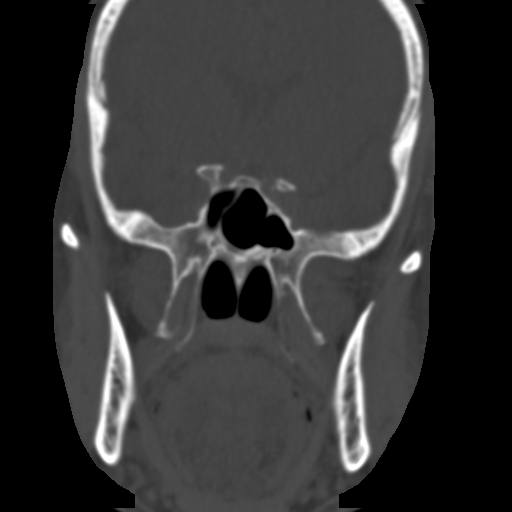
[im 6/18  brain]
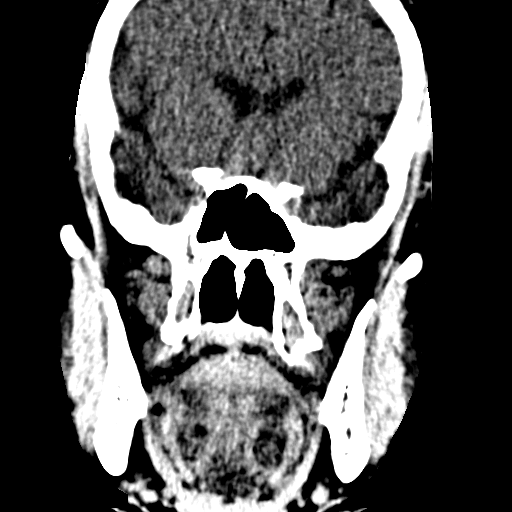
[im 6/18  bone]
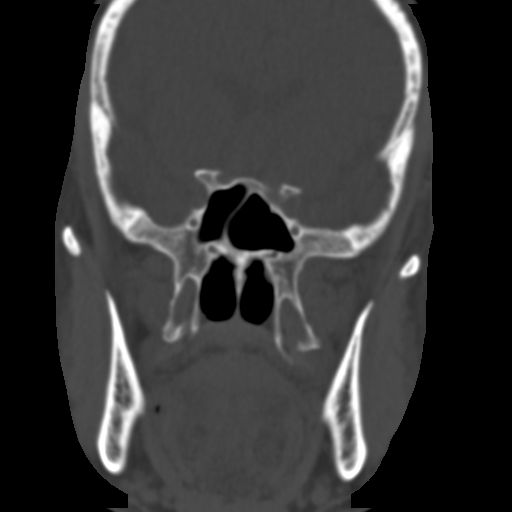
[im 7/18  bone]
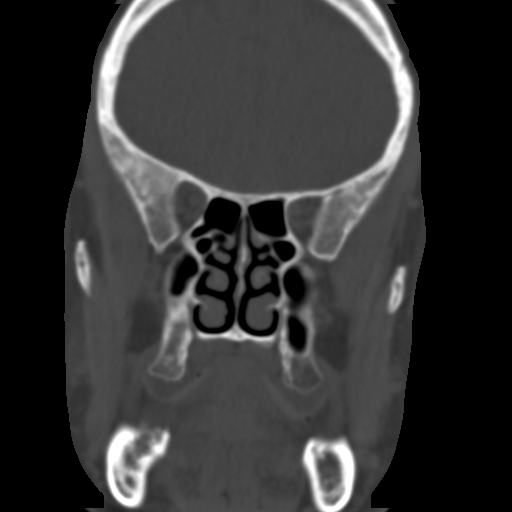
[im 8/18  bone]
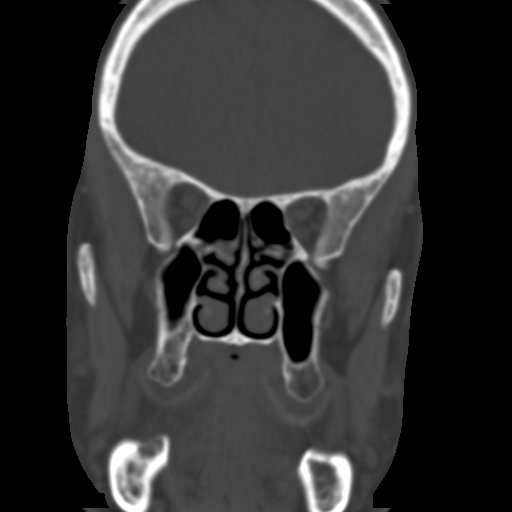
[im 9/18  bone]
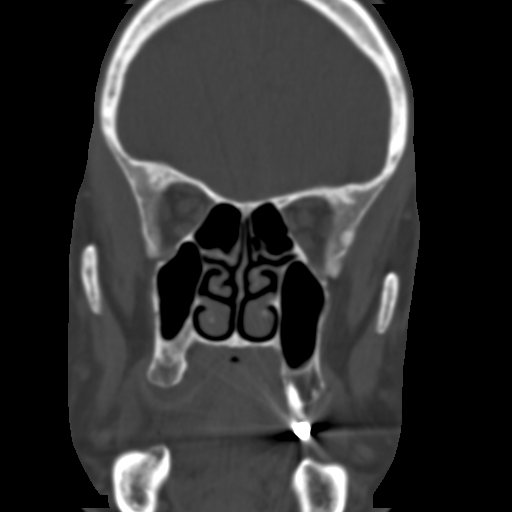
[im 10/18  brain]
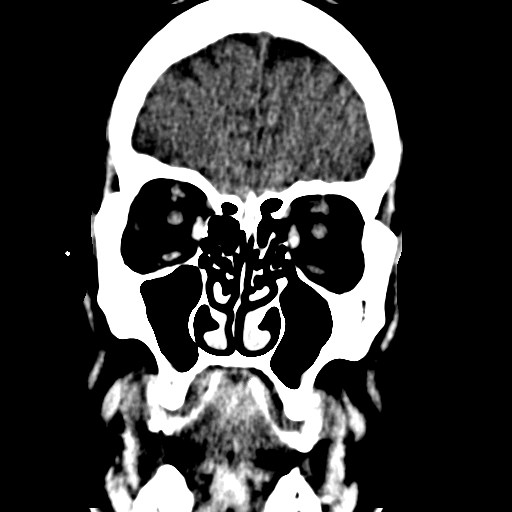
[im 10/18  bone]
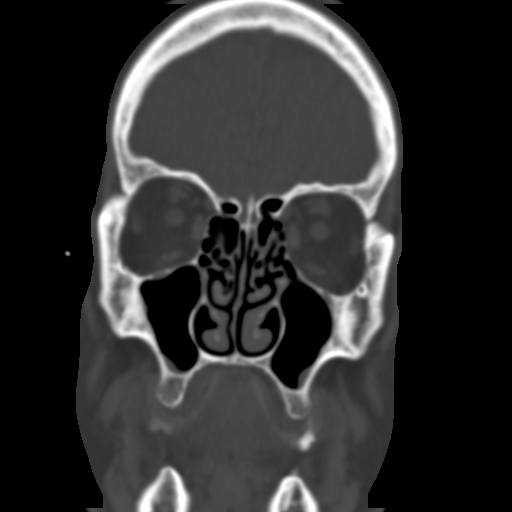
[im 11/18  bone]
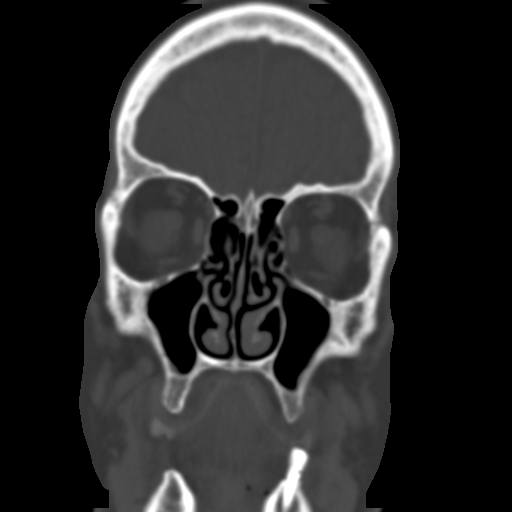
[im 12/18  bone]
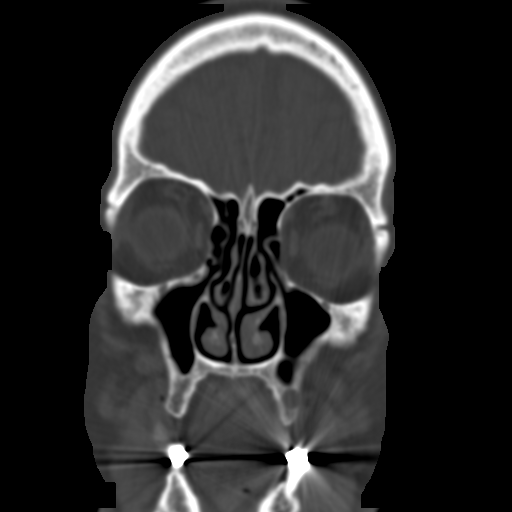
[im 13/18  bone]
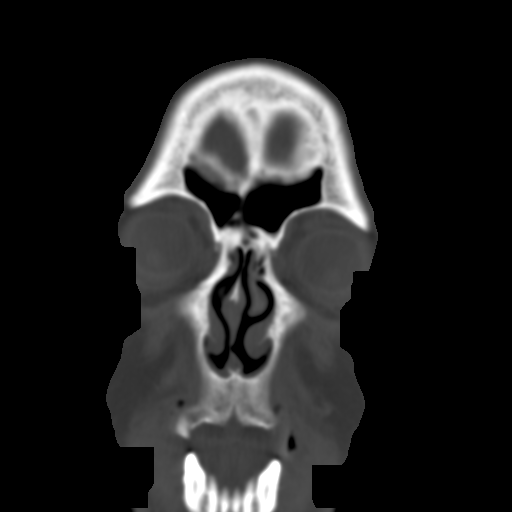
[im 14/18  brain]
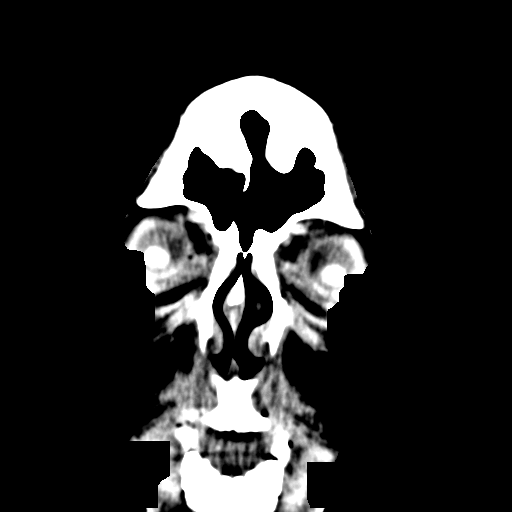
[im 14/18  bone]
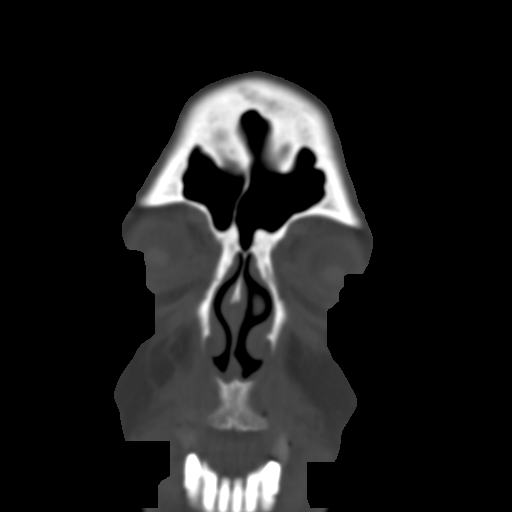
[im 15/18  bone]
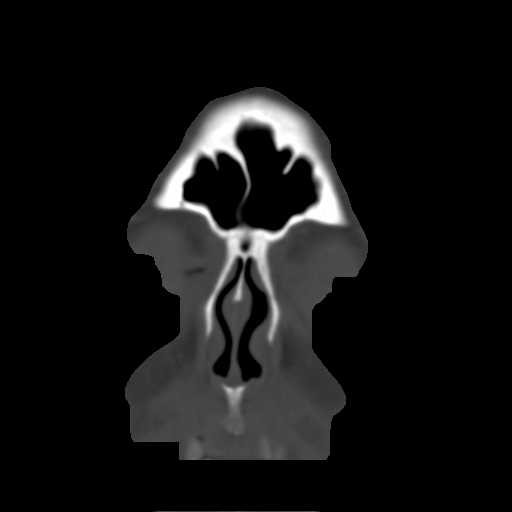
[im 16/18  bone]
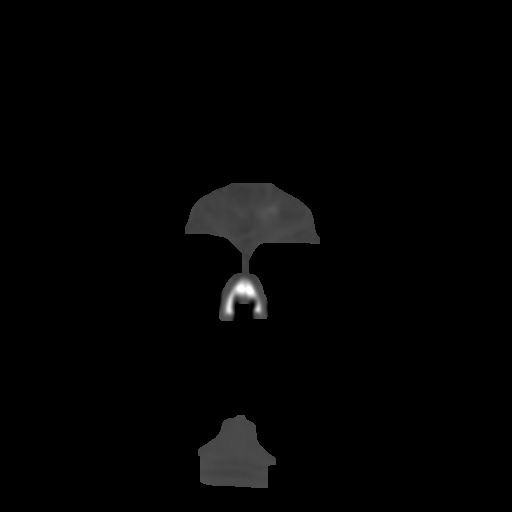
[im 17/18  bone]
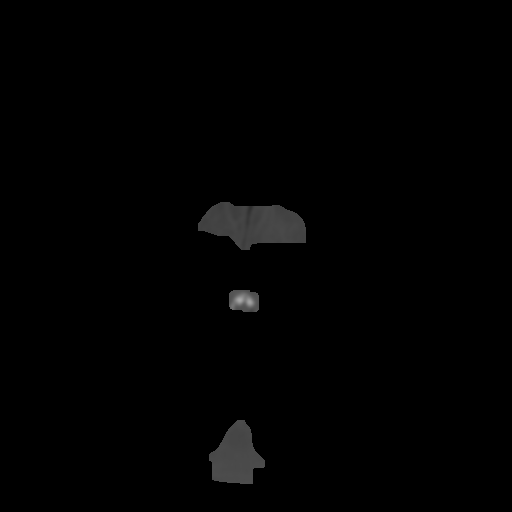

[16 of 19 positions shown; findings below may reference images not displayed]

FINDINGS: Minimal mucosal thickening is evident in the anterior
inferior maxillary sinuses bilaterally.  No other significant
mucosal disease is present.  There are air-fluid levels.  The
ostiomeatal complex is patent bilaterally.  A concha bullosa is
noted within the middle turbinates bilaterally.  The nasal cavity
is clear.

Limited imaging of the brain is unremarkable.
IMPRESSION: 1.  Minimal mucosal thickening within the inferolateral maxillary
sinuses bilaterally.
2.  No other significant active or chronic sinus disease.

## 2015-10-20 DIAGNOSIS — E785 Hyperlipidemia, unspecified: Secondary | ICD-10-CM | POA: Insufficient documentation

## 2015-10-20 DIAGNOSIS — K219 Gastro-esophageal reflux disease without esophagitis: Secondary | ICD-10-CM

## 2015-10-20 DIAGNOSIS — E782 Mixed hyperlipidemia: Secondary | ICD-10-CM

## 2015-10-20 HISTORY — DX: Mixed hyperlipidemia: E78.2

## 2015-10-20 HISTORY — DX: Gastro-esophageal reflux disease without esophagitis: K21.9

## 2016-03-30 DIAGNOSIS — K5909 Other constipation: Secondary | ICD-10-CM

## 2016-03-30 HISTORY — DX: Other constipation: K59.09

## 2017-06-11 DIAGNOSIS — M792 Neuralgia and neuritis, unspecified: Secondary | ICD-10-CM

## 2017-06-11 HISTORY — DX: Neuralgia and neuritis, unspecified: M79.2

## 2017-07-24 DIAGNOSIS — R42 Dizziness and giddiness: Secondary | ICD-10-CM

## 2017-07-24 HISTORY — DX: Dizziness and giddiness: R42

## 2018-01-07 DIAGNOSIS — M5412 Radiculopathy, cervical region: Secondary | ICD-10-CM

## 2018-01-07 DIAGNOSIS — F40240 Claustrophobia: Secondary | ICD-10-CM

## 2018-01-07 HISTORY — DX: Radiculopathy, cervical region: M54.12

## 2018-01-07 HISTORY — DX: Claustrophobia: F40.240

## 2018-01-30 DIAGNOSIS — Z823 Family history of stroke: Secondary | ICD-10-CM

## 2018-01-30 HISTORY — DX: Family history of stroke: Z82.3

## 2018-03-12 DIAGNOSIS — S76011A Strain of muscle, fascia and tendon of right hip, initial encounter: Secondary | ICD-10-CM

## 2018-03-12 HISTORY — DX: Strain of muscle, fascia and tendon of right hip, initial encounter: S76.011A

## 2018-03-18 DIAGNOSIS — M5417 Radiculopathy, lumbosacral region: Secondary | ICD-10-CM

## 2018-03-18 HISTORY — DX: Radiculopathy, lumbosacral region: M54.17

## 2019-09-08 DIAGNOSIS — I672 Cerebral atherosclerosis: Secondary | ICD-10-CM

## 2019-09-08 DIAGNOSIS — I700 Atherosclerosis of aorta: Secondary | ICD-10-CM

## 2019-09-08 DIAGNOSIS — I6523 Occlusion and stenosis of bilateral carotid arteries: Secondary | ICD-10-CM

## 2019-09-08 HISTORY — DX: Atherosclerosis of aorta: I70.0

## 2019-09-08 HISTORY — DX: Occlusion and stenosis of bilateral carotid arteries: I65.23

## 2019-09-08 HISTORY — DX: Cerebral atherosclerosis: I67.2

## 2020-08-26 DIAGNOSIS — U071 COVID-19: Secondary | ICD-10-CM

## 2020-08-26 HISTORY — DX: COVID-19: U07.1

## 2020-11-08 DIAGNOSIS — M48062 Spinal stenosis, lumbar region with neurogenic claudication: Secondary | ICD-10-CM

## 2020-11-08 HISTORY — DX: Spinal stenosis, lumbar region with neurogenic claudication: M48.062

## 2020-12-13 DIAGNOSIS — M7661 Achilles tendinitis, right leg: Secondary | ICD-10-CM

## 2020-12-13 DIAGNOSIS — M19071 Primary osteoarthritis, right ankle and foot: Secondary | ICD-10-CM | POA: Insufficient documentation

## 2020-12-13 HISTORY — DX: Primary osteoarthritis, right ankle and foot: M19.071

## 2020-12-13 HISTORY — DX: Achilles tendinitis, right leg: M76.61

## 2021-07-11 DIAGNOSIS — H5462 Unqualified visual loss, left eye, normal vision right eye: Secondary | ICD-10-CM | POA: Insufficient documentation

## 2021-07-11 HISTORY — DX: Unqualified visual loss, left eye, normal vision right eye: H54.62

## 2021-08-03 DIAGNOSIS — H510 Palsy (spasm) of conjugate gaze: Secondary | ICD-10-CM

## 2021-08-03 HISTORY — DX: Palsy (spasm) of conjugate gaze: H51.0

## 2021-11-07 DIAGNOSIS — Z01818 Encounter for other preprocedural examination: Secondary | ICD-10-CM

## 2021-11-07 HISTORY — DX: Encounter for other preprocedural examination: Z01.818

## 2021-11-24 DIAGNOSIS — M4726 Other spondylosis with radiculopathy, lumbar region: Secondary | ICD-10-CM

## 2021-11-24 HISTORY — DX: Other spondylosis with radiculopathy, lumbar region: M47.26

## 2022-05-22 DIAGNOSIS — N401 Enlarged prostate with lower urinary tract symptoms: Secondary | ICD-10-CM | POA: Insufficient documentation

## 2022-05-22 HISTORY — DX: Benign prostatic hyperplasia with lower urinary tract symptoms: N40.1

## 2022-09-25 DIAGNOSIS — R195 Other fecal abnormalities: Secondary | ICD-10-CM

## 2022-09-25 HISTORY — DX: Other fecal abnormalities: R19.5

## 2022-10-10 DIAGNOSIS — T819XXA Unspecified complication of procedure, initial encounter: Secondary | ICD-10-CM

## 2022-10-10 HISTORY — PX: LUMBAR FUSION: SHX111

## 2022-10-10 HISTORY — DX: Unspecified complication of procedure, initial encounter: T81.9XXA

## 2022-10-11 DIAGNOSIS — R6889 Other general symptoms and signs: Secondary | ICD-10-CM

## 2022-10-11 DIAGNOSIS — R269 Unspecified abnormalities of gait and mobility: Secondary | ICD-10-CM

## 2022-10-11 HISTORY — DX: Other general symptoms and signs: R68.89

## 2022-10-11 HISTORY — DX: Unspecified abnormalities of gait and mobility: R26.9

## 2022-10-16 DIAGNOSIS — T148XXA Other injury of unspecified body region, initial encounter: Secondary | ICD-10-CM

## 2022-10-16 HISTORY — DX: Other injury of unspecified body region, initial encounter: T14.8XXA

## 2022-11-03 DIAGNOSIS — I2609 Other pulmonary embolism with acute cor pulmonale: Secondary | ICD-10-CM

## 2022-11-03 HISTORY — DX: Other pulmonary embolism with acute cor pulmonale: I26.09

## 2022-11-20 DIAGNOSIS — R6889 Other general symptoms and signs: Secondary | ICD-10-CM

## 2022-11-20 HISTORY — DX: Other general symptoms and signs: R68.89

## 2022-12-11 DIAGNOSIS — I2609 Other pulmonary embolism with acute cor pulmonale: Secondary | ICD-10-CM

## 2023-03-25 DIAGNOSIS — R109 Unspecified abdominal pain: Secondary | ICD-10-CM

## 2023-03-25 HISTORY — DX: Unspecified abdominal pain: R10.9

## 2023-06-25 DIAGNOSIS — R072 Precordial pain: Secondary | ICD-10-CM | POA: Insufficient documentation

## 2023-06-25 DIAGNOSIS — R197 Diarrhea, unspecified: Secondary | ICD-10-CM

## 2023-06-25 HISTORY — DX: Precordial pain: R07.2

## 2023-06-25 HISTORY — DX: Diarrhea, unspecified: R19.7

## 2023-06-28 ENCOUNTER — Ambulatory Visit: Payer: Medicare Other

## 2023-06-28 VITALS — BP 160/82 | HR 68 | Ht 75.5 in | Wt 223.6 lb

## 2023-06-28 DIAGNOSIS — I10 Essential (primary) hypertension: Secondary | ICD-10-CM

## 2023-06-28 DIAGNOSIS — R079 Chest pain, unspecified: Secondary | ICD-10-CM | POA: Diagnosis not present

## 2023-06-28 DIAGNOSIS — I25118 Atherosclerotic heart disease of native coronary artery with other forms of angina pectoris: Secondary | ICD-10-CM | POA: Diagnosis not present

## 2023-06-28 HISTORY — DX: Chest pain, unspecified: R07.9

## 2023-06-28 MED ORDER — ROSUVASTATIN CALCIUM 5 MG PO TABS: 5.0000 mg | ORAL_TABLET | Freq: Every day | ORAL | 3 refills | Status: DC

## 2023-06-28 MED ORDER — METOPROLOL SUCCINATE ER 50 MG PO TB24: 50.0000 mg | ORAL_TABLET | Freq: Once | ORAL | 0 refills | Status: DC

## 2023-06-28 MED ORDER — ASPIRIN 81 MG PO TBEC: 81.0000 mg | DELAYED_RELEASE_TABLET | Freq: Every day | ORAL | 3 refills | Status: AC

## 2023-06-28 MED ORDER — OMEPRAZOLE 20 MG PO CPDR: 20.0000 mg | DELAYED_RELEASE_CAPSULE | Freq: Every day | ORAL | 3 refills | Status: DC

## 2023-06-28 NOTE — Assessment & Plan Note (Addendum)
Symptoms as described below, suggestive of angina. Does have cardiovascular risk factors with age, evidence of coronary and aortic atherosclerosis on prior CT imaging, hypertension.  Progressive symptoms over the past 6 to 7 months.  Will further evaluate for any significant obstructive coronary artery disease with CT coronary angiogram.  No IV dye allergy.  He prefers to get this done at [facility]. Will prescribe metoprolol tartrate 1 dose 50 mg on the morning of the test in addition to his current medications nebivolol and olmesartan, to optimize heart rates.  Advised him to start taking aspirin 81 mg once daily and Crestor 5 mg once daily given his history of coronary atherosclerosis on prior CT imaging. He has taken these medications aspirin and statins in the past without any side effects. Given his history of gastroesophageal reflux disease and atypical symptoms of abdominal discomfort suggested to take proton pump inhibitors over-the-counter Prilosec 20 mg once daily.
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
## 2023-06-28 NOTE — Patient Instructions (Signed)
Medication Instructions:  Your physician has recommended you make the following change in your medication:   START: Aspirin 81 mg daily START: Crestor 5 mg daily START: Prilosec 20 mg daily  *If you need a refill on your cardiac medications before your next appointment, please call your pharmacy*   Lab Work: Your physician recommends that you return for lab work in:   Labs today: BMP  If you have labs (blood work) drawn today and your tests are completely normal, you will receive your results only by: MyChart Message (if you have MyChart) OR A paper copy in the mail If you have any lab test that is abnormal or we need to change your treatment, we will call you to review the results.   Testing/Procedures:  Please follow these instructions carefully (unless otherwise directed):  An IV will be required for this test and Nitroglycerin will be given.  Hold all erectile dysfunction medications at least 3 days (72 hrs) prior to test. (Ie viagra, cialis, sildenafil, tadalafil, etc)   On the Night Before the Test: Be sure to Drink plenty of water. Do not consume any caffeinated/decaffeinated beverages or chocolate 12 hours prior to your test. Do not take any antihistamines 12 hours prior to your test.  On the Day of the Test: Drink plenty of water until 1 hour prior to the test. Do not eat any food 1 hour prior to test. You may take your regular medications prior to the test.  Take metoprolol (Lopressor) the morning of your test.       After the Test: Drink plenty of water. After receiving IV contrast, you may experience a mild flushed feeling. This is normal. On occasion, you may experience a mild rash up to 24 hours after the test. This is not dangerous. If this occurs, you can take Benadryl 25 mg and increase your fluid intake. If you experience trouble breathing, this can be serious. If it is severe call 911 IMMEDIATELY. If it is mild, please call our office.  We will call to  schedule your test 2-4 weeks out understanding that some insurance companies will need an authorization prior to the service being performed.   Follow-Up: At [facility], you and your health needs are our priority.  As part of our continuing mission to provide you with exceptional heart care, we have created designated Provider Care Teams.  These Care Teams include your primary Cardiologist (physician) and Advanced Practice Providers (APPs -  Physician Assistants and Nurse Practitioners) who all work together to provide you with the care you need, when you need it.  We recommend signing up for the patient portal called "MyChart".  Sign up information is provided on this After Visit Summary.  MyChart is used to connect with patients for Virtual Visits (Telemedicine).  Patients are able to view lab/test results, encounter notes, upcoming appointments, etc.  Non-urgent messages can be sent to your provider as well.   To learn more about what you can do with MyChart, go to [email or web address].    Your next appointment:   Follow up to be determined based on testing results  Provider:   Huntley Dec, MD    Other Instructions None

## 2023-06-28 NOTE — Assessment & Plan Note (Addendum)
Suboptimally controlled today. Mentions usually well-controlled on current regimen nebivolol and olmesartan. Did not take his medications this morning.  Advised him to take his medications once he is done with the visit today.  Advised to keep a log of blood pressure readings consistently and have it checked with PCPs office or through our office.  Reviewed blood pressure below 130 over 80 mmHg.

## 2023-06-29 LAB — BASIC METABOLIC PANEL
BUN/Creatinine Ratio: 15 (ref 10–24)
BUN: 14 mg/dL (ref 8–27)
CO2: 23 mmol/L (ref 20–29)
Calcium: 9.5 mg/dL (ref 8.6–10.2)
Chloride: 103 mmol/L (ref 96–106)
Creatinine, Ser: 0.93 mg/dL (ref 0.76–1.27)
Glucose: 89 mg/dL (ref 70–99)
Potassium: 4.7 mmol/L (ref 3.5–5.2)
Sodium: 141 mmol/L (ref 134–144)
eGFR: 84 mL/min/{1.73_m2} (ref >59)

## 2023-07-10 ENCOUNTER — Ambulatory Visit: Payer: Medicare Other

## 2023-07-10 VITALS — BP 130/88 | HR 62 | Ht 75.0 in | Wt 226.6 lb

## 2023-07-10 DIAGNOSIS — I25118 Atherosclerotic heart disease of native coronary artery with other forms of angina pectoris: Secondary | ICD-10-CM | POA: Diagnosis not present

## 2023-07-10 DIAGNOSIS — I2510 Atherosclerotic heart disease of native coronary artery without angina pectoris: Secondary | ICD-10-CM

## 2023-07-10 HISTORY — DX: Atherosclerotic heart disease of native coronary artery without angina pectoris: I25.10
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
## 2023-07-10 NOTE — Assessment & Plan Note (Signed)
He was able to come in for a quick follow-up visit after being notified of the abnormal results of the CT coronary angiogram.  Reviewed the results at length.  In the setting of left main and LAD disease which appears to suggest severe stenosis, recommended further evaluation with cardiac catheterization.  Reviewed the procedure at length involving need for being n.p.o. before, various medication changes that need to be done for the procedure, instructions regarding anticipating staying overnight at the hospital postprocedure if necessary.  Shared Decision Making/Informed Consent{ The risks [stroke (1 in 1000), death (1 in 1000), kidney failure [usually temporary] (1 in 500), bleeding (1 in 200), allergic reaction [possibly serious] (1 in 200)], benefits (diagnostic support and management of coronary artery disease) and alternatives of a cardiac catheterization were discussed in detail with him and he is willing to proceed.  He wishes to proceed with this early next week rather than this week. I think this is reasonable given he just received IV contrast yesterday, to allow his kidneys to recover.  Advised him to avoid any moderate to heavy exertion activities.  Based on coronary angiogram results after cardiac catheterization decision will be made by the interventional cardiologist with regards to best revascularization options if necessary with PCI versus surgical.  He understands this and is willing to proceed with the surgery.  Continue aspirin 81 mg once daily Continue nebivolol 10 mg once a day Continue olmesartan 40 mg once daily for his blood pressure control. Titrate up the dose of Crestor to 20 mg once daily.
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
## 2023-07-12 MED ORDER — ROSUVASTATIN CALCIUM 20 MG PO TABS: 20.0000 mg | ORAL_TABLET | Freq: Every day | ORAL | 3 refills | Status: DC

## 2023-07-12 NOTE — Addendum Note (Signed)
Addended by: Roxanne Mins I on: 07/12/2023 05:12 PM   Modules accepted: Orders

## 2023-07-12 NOTE — Patient Instructions (Addendum)
Medication Instructions:  Your physician has recommended you make the following change in your medication:   START: Crestor 20 mg daily  *If you need a refill on your cardiac medications before your next appointment, please call your pharmacy*   Lab Work: Your physician recommends that you return for lab work in:   Labs: CBC, BMP  If you have labs (blood work) drawn today and your tests are completely normal, you will receive your results only by: MyChart Message (if you have MyChart) OR A paper copy in the mail If you have any lab test that is abnormal or we need to change your treatment, we will call you to review the results.   Testing/Procedures:  [facility] [facility] [address] [place] [address] Dept: [phone] Loc: [phone]  Chad Gordon  07/12/2023  You are scheduled for a Cardiac Catheterization on Wednesday, December 11 with Dr. Bryan Lemma.  1. Please arrive at the [facility] (Main Entrance A) at [facility]: [address] [place], [place] [address] at 8:00 AM (This time is 2 hour(s) before your procedure to ensure your preparation).   Free valet parking service is available. You will check in at ADMITTING. The support person will be asked to wait in the waiting room.  It is OK to have someone drop you off and come back when you are ready to be discharged.    Special note: Every effort is made to have your procedure done on time. Please understand that emergencies sometimes delay scheduled procedures.  2. Diet: Do not eat solid foods after midnight.  The patient may have clear liquids until 5am upon the day of the procedure.  3. Labs: You will need to have blood drawn on Monday, December 9 at [facility]: [address], [place], [place] . You do not need to be fasting.  4. Medication instructions in preparation for your procedure: None   Contrast Allergy:  No  On the morning of your procedure, take your Aspirin 81 mg and any morning medicines NOT listed above.  You may use sips of water.  5. Plan to go home the same day, you will only stay overnight if medically necessary. 6. Bring a current list of your medications and current insurance cards. 7. You MUST have a responsible person to drive you home. 8. Someone MUST be with you the first 24 hours after you arrive home or your discharge will be delayed. 9. Please wear clothes that are easy to get on and off and wear slip-on shoes.  Thank you for allowing [place] to care for you!   -- [facility]    Follow-Up: At [facility], you and your health needs are our priority.  As part of our continuing mission to provide you with exceptional heart care, we have created designated Provider Care Teams.  These Care Teams include your primary Cardiologist (physician) and Advanced Practice Providers (APPs -  Physician Assistants and Nurse Practitioners) who all work together to provide you with the care you need, when you need it.  We recommend signing up for the patient portal called "MyChart".  Sign up information is provided on this After Visit Summary.  MyChart is used to connect with patients for Virtual Visits (Telemedicine).  Patients are able to view lab/test results, encounter notes, upcoming appointments, etc.  Non-urgent messages can be sent to your provider as well.  To learn more about what you can do with MyChart, go to [email or web address].    Your next appointment:   Follow up to be determined after the cardiac cath  Provider:   Huntley Dec, MD    Other Instructions None

## 2023-07-15 ENCOUNTER — Telehealth: Payer: Self-pay | Admitting: *Deleted

## 2023-07-15 NOTE — Telephone Encounter (Signed)
Cardiac Catheterization scheduled at [facility] for: Wednesday July 17, 2023 10 AM Arrival time [facility] Main Entrance A at: 8 AM  Nothing to eat after midnight prior to procedure, clear liquids until 5 AM day of procedure.  Medication instructions: -Usual morning medications can be taken with sips of water including aspirin 81 mg.  Plan to go home the same day, you will only stay overnight if medically necessary.  You must have responsible adult to drive you home.  Someone must be with you the first 24 hours after you arrive home.  Reviewed procedure instructions with patient.  Patient bilateral upper extremity below elbow amputee, reports he does not need any special equipment while at the hospital.

## 2023-07-16 LAB — BASIC METABOLIC PANEL
BUN/Creatinine Ratio: 14 (ref 10–24)
BUN: 13 mg/dL (ref 8–27)
CO2: 24 mmol/L (ref 20–29)
Calcium: 9.9 mg/dL (ref 8.6–10.2)
Chloride: 103 mmol/L (ref 96–106)
Creatinine, Ser: 0.96 mg/dL (ref 0.76–1.27)
Glucose: 96 mg/dL (ref 70–99)
Potassium: 4.2 mmol/L (ref 3.5–5.2)
Sodium: 140 mmol/L (ref 134–144)
eGFR: 80 mL/min/{1.73_m2} (ref >59)

## 2023-07-16 LAB — CBC
Hematocrit: 50.6 % (ref 37.5–51.0)
Hemoglobin: 16.7 g/dL (ref 13.0–17.7)
MCH: 29.2 pg (ref 26.6–33.0)
MCHC: 33 g/dL (ref 31.5–35.7)
MCV: 89 fL (ref 79–97)
Platelets: 351 10*3/uL (ref 150–450)
RBC: 5.71 x10E6/uL (ref 4.14–5.80)
RDW: 13.9 % (ref 11.6–15.4)
WBC: 7.6 10*3/uL (ref 3.4–10.8)

## 2023-07-17 ENCOUNTER — Encounter (HOSPITAL_COMMUNITY): Payer: Self-pay | Admitting: Cardiology

## 2023-07-17 ENCOUNTER — Inpatient Hospital Stay (HOSPITAL_COMMUNITY)
Admission: AD | Admit: 2023-07-17 | Discharge: 2023-07-28 | DRG: 234 | Disposition: A | Discharge status: Home Health | Payer: Medicare Other | Attending: Thoracic Surgery (Cardiothoracic Vascular Surgery) | Admitting: Thoracic Surgery (Cardiothoracic Vascular Surgery)

## 2023-07-17 ENCOUNTER — Other Ambulatory Visit: Payer: Self-pay

## 2023-07-17 ENCOUNTER — Encounter (HOSPITAL_COMMUNITY)
Admission: AD | Disposition: A | Discharge status: Home Health | Payer: Self-pay | Source: Home / Self Care | Attending: Cardiology

## 2023-07-17 ENCOUNTER — Inpatient Hospital Stay (HOSPITAL_COMMUNITY): Payer: Medicare Other

## 2023-07-17 DIAGNOSIS — I25110 Atherosclerotic heart disease of native coronary artery with unstable angina pectoris: Secondary | ICD-10-CM | POA: Diagnosis present

## 2023-07-17 DIAGNOSIS — Z602 Problems related to living alone: Secondary | ICD-10-CM | POA: Diagnosis present

## 2023-07-17 DIAGNOSIS — I10 Essential (primary) hypertension: Secondary | ICD-10-CM | POA: Diagnosis present

## 2023-07-17 DIAGNOSIS — K219 Gastro-esophageal reflux disease without esophagitis: Secondary | ICD-10-CM | POA: Diagnosis present

## 2023-07-17 DIAGNOSIS — E871 Hypo-osmolality and hyponatremia: Secondary | ICD-10-CM | POA: Diagnosis not present

## 2023-07-17 DIAGNOSIS — Z79899 Other long term (current) drug therapy: Secondary | ICD-10-CM | POA: Diagnosis not present

## 2023-07-17 DIAGNOSIS — Z8249 Family history of ischemic heart disease and other diseases of the circulatory system: Secondary | ICD-10-CM

## 2023-07-17 DIAGNOSIS — F40240 Claustrophobia: Secondary | ICD-10-CM | POA: Diagnosis present

## 2023-07-17 DIAGNOSIS — Z87891 Personal history of nicotine dependence: Secondary | ICD-10-CM | POA: Diagnosis not present

## 2023-07-17 DIAGNOSIS — Z888 Allergy status to other drugs, medicaments and biological substances status: Secondary | ICD-10-CM

## 2023-07-17 DIAGNOSIS — H5462 Unqualified visual loss, left eye, normal vision right eye: Secondary | ICD-10-CM | POA: Diagnosis present

## 2023-07-17 DIAGNOSIS — I2510 Atherosclerotic heart disease of native coronary artery without angina pectoris: Principal | ICD-10-CM | POA: Diagnosis present

## 2023-07-17 DIAGNOSIS — R110 Nausea: Secondary | ICD-10-CM | POA: Diagnosis not present

## 2023-07-17 DIAGNOSIS — I2584 Coronary atherosclerosis due to calcified coronary lesion: Secondary | ICD-10-CM | POA: Diagnosis present

## 2023-07-17 DIAGNOSIS — Z89212 Acquired absence of left upper limb below elbow: Secondary | ICD-10-CM | POA: Diagnosis not present

## 2023-07-17 DIAGNOSIS — Z86711 Personal history of pulmonary embolism: Secondary | ICD-10-CM | POA: Diagnosis not present

## 2023-07-17 DIAGNOSIS — G43109 Migraine with aura, not intractable, without status migrainosus: Secondary | ICD-10-CM | POA: Diagnosis present

## 2023-07-17 DIAGNOSIS — N179 Acute kidney failure, unspecified: Secondary | ICD-10-CM | POA: Diagnosis not present

## 2023-07-17 DIAGNOSIS — Z89211 Acquired absence of right upper limb below elbow: Secondary | ICD-10-CM | POA: Diagnosis not present

## 2023-07-17 DIAGNOSIS — R269 Unspecified abnormalities of gait and mobility: Secondary | ICD-10-CM | POA: Diagnosis present

## 2023-07-17 DIAGNOSIS — Z951 Presence of aortocoronary bypass graft: Secondary | ICD-10-CM | POA: Diagnosis not present

## 2023-07-17 DIAGNOSIS — E782 Mixed hyperlipidemia: Secondary | ICD-10-CM | POA: Diagnosis present

## 2023-07-17 DIAGNOSIS — E8770 Fluid overload, unspecified: Secondary | ICD-10-CM | POA: Diagnosis not present

## 2023-07-17 DIAGNOSIS — D62 Acute posthemorrhagic anemia: Secondary | ICD-10-CM | POA: Diagnosis not present

## 2023-07-17 DIAGNOSIS — D6959 Other secondary thrombocytopenia: Secondary | ICD-10-CM | POA: Diagnosis present

## 2023-07-17 DIAGNOSIS — I200 Unstable angina: Secondary | ICD-10-CM | POA: Diagnosis not present

## 2023-07-17 DIAGNOSIS — Z8616 Personal history of COVID-19: Secondary | ICD-10-CM

## 2023-07-17 DIAGNOSIS — Z96653 Presence of artificial knee joint, bilateral: Secondary | ICD-10-CM | POA: Diagnosis present

## 2023-07-17 DIAGNOSIS — Z7982 Long term (current) use of aspirin: Secondary | ICD-10-CM

## 2023-07-17 DIAGNOSIS — I25118 Atherosclerotic heart disease of native coronary artery with other forms of angina pectoris: Principal | ICD-10-CM | POA: Diagnosis present

## 2023-07-17 DIAGNOSIS — I6523 Occlusion and stenosis of bilateral carotid arteries: Secondary | ICD-10-CM | POA: Diagnosis present

## 2023-07-17 DIAGNOSIS — Z01810 Encounter for preprocedural cardiovascular examination: Secondary | ICD-10-CM | POA: Diagnosis not present

## 2023-07-17 DIAGNOSIS — Z832 Family history of diseases of the blood and blood-forming organs and certain disorders involving the immune mechanism: Secondary | ICD-10-CM

## 2023-07-17 HISTORY — DX: Atherosclerotic heart disease of native coronary artery without angina pectoris: I25.10

## 2023-07-17 HISTORY — PX: LEFT HEART CATH AND CORONARY ANGIOGRAPHY: CATH118249

## 2023-07-17 LAB — ECHOCARDIOGRAM COMPLETE
AR max vel: 3.44 cm2
AV Area VTI: 2.92 cm2
AV Area mean vel: 2.73 cm2
AV Mean grad: 4 mm[Hg]
AV Peak grad: 6.7 mm[Hg]
Ao pk vel: 1.29 m/s
Area-P 1/2: 3.08 cm2
Height: 75 in
S' Lateral: 2.8 cm
Weight: 3616 [oz_av]

## 2023-07-17 SURGERY — LEFT HEART CATH AND CORONARY ANGIOGRAPHY
Anesthesia: LOCAL

## 2023-07-17 MED ORDER — LABETALOL HCL 5 MG/ML IV SOLN: 10.0000 mg | Freq: Once | INTRAVENOUS | Status: AC

## 2023-07-17 MED ORDER — MIDAZOLAM HCL 2 MG/2ML IJ SOLN
INTRAMUSCULAR | Status: AC
  Filled 2023-07-17: qty 2

## 2023-07-17 MED ORDER — LIDOCAINE HCL (PF) 1 % IJ SOLN
INTRAMUSCULAR | Status: DC | PRN
  Administered 2023-07-17: 5 mL

## 2023-07-17 MED ORDER — PANTOPRAZOLE SODIUM 40 MG PO TBEC
40.0000 mg | DELAYED_RELEASE_TABLET | Freq: Every day | ORAL | Status: DC
Start: 2023-07-18 — End: 2023-07-24
  Administered 2023-07-18 – 2023-07-23 (×6): 40 mg via ORAL
  Filled 2023-07-17 (×6): qty 1

## 2023-07-17 MED ORDER — SODIUM CHLORIDE 0.9 % WEIGHT BASED INFUSION: 1.0000 mL/kg/h | INTRAVENOUS | Status: DC

## 2023-07-17 MED ORDER — FENTANYL CITRATE (PF) 100 MCG/2ML IJ SOLN
INTRAMUSCULAR | Status: AC
  Filled 2023-07-17: qty 2

## 2023-07-17 MED ORDER — LABETALOL HCL 5 MG/ML IV SOLN
INTRAVENOUS | Status: AC
  Administered 2023-07-17: 10 mg via INTRAVENOUS
  Filled 2023-07-17: qty 4

## 2023-07-17 MED ORDER — ROSUVASTATIN CALCIUM 20 MG PO TABS
40.0000 mg | ORAL_TABLET | Freq: Every day | ORAL | Status: DC
  Administered 2023-07-18 – 2023-07-28 (×10): 40 mg via ORAL
  Filled 2023-07-17 (×10): qty 2

## 2023-07-17 MED ORDER — SODIUM CHLORIDE 0.9% FLUSH: 3.0000 mL | INTRAVENOUS | Status: DC | PRN

## 2023-07-17 MED ORDER — LABETALOL HCL 5 MG/ML IV SOLN
INTRAVENOUS | Status: DC | PRN
  Administered 2023-07-17 (×2): 10 mg via INTRAVENOUS

## 2023-07-17 MED ORDER — ACETAMINOPHEN 325 MG PO TABS
650.0000 mg | ORAL_TABLET | ORAL | Status: DC | PRN
Start: 2023-07-17 — End: 2023-07-24

## 2023-07-17 MED ORDER — LABETALOL HCL 5 MG/ML IV SOLN
INTRAVENOUS | Status: AC
  Filled 2023-07-17: qty 4

## 2023-07-17 MED ORDER — FENTANYL CITRATE (PF) 100 MCG/2ML IJ SOLN
INTRAMUSCULAR | Status: DC | PRN
  Administered 2023-07-17: 25 ug via INTRAVENOUS

## 2023-07-17 MED ORDER — ONDANSETRON HCL 4 MG/2ML IJ SOLN: 4.0000 mg | Freq: Four times a day (QID) | INTRAMUSCULAR | Status: DC | PRN

## 2023-07-17 MED ORDER — SODIUM CHLORIDE 0.9 % IV SOLN: INTRAVENOUS | Status: AC

## 2023-07-17 MED ORDER — PERFLUTREN LIPID MICROSPHERE
1.0000 mL | INTRAVENOUS | Status: AC | PRN
  Administered 2023-07-17: 2 mL via INTRAVENOUS

## 2023-07-17 MED ORDER — SODIUM CHLORIDE 0.9 % WEIGHT BASED INFUSION
3.0000 mL/kg/h | INTRAVENOUS | Status: DC
  Administered 2023-07-17: 3 mL/kg/h via INTRAVENOUS

## 2023-07-17 MED ORDER — HYDRALAZINE HCL 20 MG/ML IJ SOLN
10.0000 mg | INTRAMUSCULAR | Status: AC | PRN
Start: 2023-07-17 — End: 2023-07-17

## 2023-07-17 MED ORDER — HEPARIN (PORCINE) IN NACL 1000-0.9 UT/500ML-% IV SOLN
INTRAVENOUS | Status: DC | PRN
  Administered 2023-07-17 (×2): 500 mL

## 2023-07-17 MED ORDER — NITROGLYCERIN IN D5W 200-5 MCG/ML-% IV SOLN
INTRAVENOUS | Status: AC
  Administered 2023-07-17: 10 ug/min via INTRAVENOUS
  Filled 2023-07-17: qty 250

## 2023-07-17 MED ORDER — HYDRALAZINE HCL 20 MG/ML IJ SOLN
INTRAMUSCULAR | Status: DC | PRN
  Administered 2023-07-17: 20 mg via INTRAVENOUS

## 2023-07-17 MED ORDER — SODIUM CHLORIDE 0.9 % IV SOLN: 250.0000 mL | INTRAVENOUS | Status: AC | PRN

## 2023-07-17 MED ORDER — NEBIVOLOL HCL 10 MG PO TABS
10.0000 mg | ORAL_TABLET | Freq: Every day | ORAL | Status: DC
Start: 2023-07-18 — End: 2023-07-18
  Filled 2023-07-17: qty 1

## 2023-07-17 MED ORDER — IRBESARTAN 150 MG PO TABS
300.0000 mg | ORAL_TABLET | Freq: Every day | ORAL | Status: AC
  Administered 2023-07-18 – 2023-07-22 (×5): 300 mg via ORAL
  Filled 2023-07-17 (×5): qty 2

## 2023-07-17 MED ORDER — HEPARIN SODIUM (PORCINE) 5000 UNIT/ML IJ SOLN
5000.0000 [IU] | Freq: Three times a day (TID) | INTRAMUSCULAR | Status: DC
  Administered 2023-07-17 – 2023-07-24 (×20): 5000 [IU] via SUBCUTANEOUS
  Filled 2023-07-17 (×20): qty 1

## 2023-07-17 MED ORDER — IOHEXOL 350 MG/ML SOLN
INTRAVENOUS | Status: DC | PRN
  Administered 2023-07-17: 60 mL

## 2023-07-17 MED ORDER — LIDOCAINE HCL (PF) 1 % IJ SOLN
INTRAMUSCULAR | Status: AC
  Filled 2023-07-17: qty 30

## 2023-07-17 MED ORDER — HYDRALAZINE HCL 20 MG/ML IJ SOLN
INTRAMUSCULAR | Status: AC
  Filled 2023-07-17: qty 1

## 2023-07-17 MED ORDER — SODIUM CHLORIDE 0.9% FLUSH
3.0000 mL | Freq: Two times a day (BID) | INTRAVENOUS | Status: DC
  Administered 2023-07-18 – 2023-07-23 (×10): 3 mL via INTRAVENOUS

## 2023-07-17 MED ORDER — MIDAZOLAM HCL 2 MG/2ML IJ SOLN
INTRAMUSCULAR | Status: DC | PRN
  Administered 2023-07-17: 2 mg via INTRAVENOUS

## 2023-07-17 MED ORDER — NITROGLYCERIN IN D5W 200-5 MCG/ML-% IV SOLN: 0.0000 ug/min | INTRAVENOUS | Status: DC

## 2023-07-17 MED ORDER — LABETALOL HCL 5 MG/ML IV SOLN: 10.0000 mg | INTRAVENOUS | Status: AC | PRN

## 2023-07-17 MED ORDER — ASPIRIN 81 MG PO TBEC
81.0000 mg | DELAYED_RELEASE_TABLET | Freq: Every day | ORAL | Status: DC
Start: 2023-07-18 — End: 2023-07-24
  Administered 2023-07-18 – 2023-07-23 (×6): 81 mg via ORAL
  Filled 2023-07-17 (×6): qty 1

## 2023-07-17 SURGICAL SUPPLY — 8 items
CATH INFINITI 5FR MULTPACK ANG (CATHETERS) IMPLANT
KIT MICROPUNCTURE NIT STIFF (SHEATH) IMPLANT
MAT PREVALON FULL STRYKER (MISCELLANEOUS) IMPLANT
PACK CARDIAC CATHETERIZATION (CUSTOM PROCEDURE TRAY) ×1 IMPLANT
SET ATX-X65L (MISCELLANEOUS) IMPLANT
SHEATH PINNACLE 5F 10CM (SHEATH) IMPLANT
SHEATH PROBE COVER 6X72 (BAG) IMPLANT
WIRE EMERALD 3MM-J .035X150CM (WIRE) IMPLANT

## 2023-07-17 NOTE — Progress Notes (Signed)
Site area: Right groin a 5 french arterial sheath was removed  Site Prior to Removal:  Level 0  Pressure Applied For 30 MINUTES    Bedrest Beginning at 1200n X 4 hours  Manual:   Yes.    Patient Status During Pull:  stable  Post Pull Groin Site:  Level 0  Post Pull Instructions Given:  Yes.    Post Pull Pulses Present:  Yes.    Dressing Applied:  Yes.    Comments:  Stable.  NTG stopped due to drop in BP

## 2023-07-17 NOTE — Interval H&P Note (Signed)
History and Physical Interval Note:  07/17/2023 8:14 AM  Chad Gordon  has presented today for surgery, with the diagnosis of cad - Abnormal Coronary CT Angiogram:   The various methods of treatment have been discussed with the patient and family. After consideration of risks, benefits and other options for treatment, the patient has consented to  Procedure(s): LEFT HEART CATH AND CORONARY ANGIOGRAPHY (N/A)  PERCUTANEOUS CORONARY INTERVENTION  as a surgical intervention.  The patient's history has been reviewed, patient examined, no change in status, stable for surgery.  I have reviewed the patient's chart and labs.  Questions were answered to the patient's satisfaction.    Cath Lab Visit (complete for each Cath Lab visit)  Clinical Evaluation Leading to the Procedure:   ACS: No.  Non-ACS:    Anginal Classification: CCS III  Anti-ischemic medical therapy: Minimal Therapy (1 class of medications)  Non-Invasive Test Results: High-risk stress test findings: cardiac mortality >3%/year  Prior CABG: No previous CABG   Bryan Lemma

## 2023-07-17 NOTE — Plan of Care (Signed)
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
## 2023-07-18 ENCOUNTER — Encounter (HOSPITAL_COMMUNITY): Payer: Self-pay | Admitting: Cardiology

## 2023-07-18 DIAGNOSIS — I2510 Atherosclerotic heart disease of native coronary artery without angina pectoris: Secondary | ICD-10-CM | POA: Diagnosis not present

## 2023-07-18 LAB — CBC
HCT: 46 % (ref 39.0–52.0)
Hemoglobin: 15.2 g/dL (ref 13.0–17.0)
MCH: 28.5 pg (ref 26.0–34.0)
MCHC: 33 g/dL (ref 30.0–36.0)
MCV: 86.1 fL (ref 80.0–100.0)
Platelets: 337 10*3/uL (ref 150–400)
RBC: 5.34 MIL/uL (ref 4.22–5.81)
RDW: 14.6 % (ref 11.5–15.5)
WBC: 8.5 10*3/uL (ref 4.0–10.5)
nRBC: 0 % (ref 0.0–0.2)

## 2023-07-18 LAB — CREATININE, SERUM
Creatinine, Ser: 1.32 mg/dL — ABNORMAL HIGH (ref 0.61–1.24)
GFR, Estimated: 55 mL/min — ABNORMAL LOW (ref >60)

## 2023-07-18 LAB — GLUCOSE, CAPILLARY: Glucose-Capillary: 94 mg/dL (ref 70–99)

## 2023-07-18 MED ORDER — AMLODIPINE BESYLATE 5 MG PO TABS
5.0000 mg | ORAL_TABLET | Freq: Every day | ORAL | Status: DC
  Administered 2023-07-18 – 2023-07-19 (×2): 5 mg via ORAL
  Filled 2023-07-18 (×2): qty 1

## 2023-07-18 MED ORDER — CARVEDILOL 25 MG PO TABS
25.0000 mg | ORAL_TABLET | Freq: Two times a day (BID) | ORAL | Status: DC
  Administered 2023-07-18 – 2023-07-23 (×11): 25 mg via ORAL
  Filled 2023-07-18 (×11): qty 1

## 2023-07-18 NOTE — Progress Notes (Signed)
   07/18/23 1123  Spiritual Encounters  Type of Visit Initial  Care provided to: Patient  Reason for visit Advance directives  OnCall Visit No   Chaplain Assessment  Chaplain's Reason for Visit: Chaplain visited Pt in response to Spiritual Care Consult seeking an Advance Care Directive Chaplain time spent: 30 minutes Chaplain Interventions: Delivered Advance Care Directive education Explored Pt spiritual, emotional, and relational needs and resources Facilitated life review through asking open-ended questions Listened empathetically as Pt emotionally processed current crisis Chaplain Assessment: Pt Needs Spiritual: No needs identified Emotional: Feelings of frustration regarding his advocacy for his wife's medical care (see below) Relational: No needs identified Intermediate hopes: Get through current personal health crisis and return to advocating for his wife's care. Ultimate hopes: Not identified Pt Resources  Resources Identified:  Pt possesses personal strength of mind and spirit as he has lived his entire life with a disability which he has not allowed to limit him. Pt has strong family support from sons, one of whom is staying here locally to help. (Needs Support, Supported, Well Supported & Strong) Spiritual: Supported Emotional: Well Supported & Strong Relational: Well Supported & Strong  Additional Assessment: Pt is a strong and engaging individual, who possesses an inner strength he credit as coming from his mother who raised three boys on her own after father passed away. Pt is also mentally strong as he lost his arms at 65 months old and has not let it limit him.  He has been a Charity fundraiser Pt's wife is suffering from hydrocephalus and in a skilled nursing facility.  Pt and son are frustrated as they work to advocate for her care.  Pt wanting to get through his health crisis to be able to return to a place of caring for her.  Chaplaincy Plan: Lunette Stands  will continue to follow Pt through heart surgery and until discharge.  Chaplain Raelene Bott, MDiv [email or web address]

## 2023-07-18 NOTE — Progress Notes (Signed)
At around 0027H, patient's blood pressure went up to 203/98. Patient denies any symptoms. Notified Dr Granville Lewis and ordered to increase NTG drip. Increased NTG gtt to 70mcg/min. Will continue to monitor blood pressure. Felicity Coyer, RN

## 2023-07-18 NOTE — Plan of Care (Signed)
  Problem: Activity: Goal: Ability to return to baseline activity level will improve Outcome: Completed/Met   Problem: Cardiovascular: Goal: Ability to achieve and maintain adequate cardiovascular perfusion will improve Outcome: Progressing Goal: Vascular access site(s) Level 0-1 will be maintained Outcome: Completed/Met   Problem: Health Behavior/Discharge Planning: Goal: Ability to safely manage health-related needs after discharge will improve Outcome: Progressing

## 2023-07-18 NOTE — Progress Notes (Signed)
Patient is refusing to have his blood drawn for labs. Dr. Granville Lewis made aware.

## 2023-07-18 NOTE — Progress Notes (Signed)
   07/18/23 0027  Assess: MEWS Score  BP (!) 203/98  MAP (mmHg) 127  Pulse Rate 70  ECG Heart Rate 70  Resp 18  Level of Consciousness Alert  SpO2 97 %  O2 Device Room Air  Assess: MEWS Score  MEWS Temp 0  MEWS Systolic 2  MEWS Pulse 0  MEWS RR 0  MEWS LOC 0  MEWS Score 2  MEWS Score Color Yellow  Assess: if the MEWS score is Yellow or Red  Were vital signs accurate and taken at a resting state? Yes  Does the patient meet 2 or more of the SIRS criteria? No  MEWS guidelines implemented  Yes, yellow  Treat  MEWS Interventions Considered administering scheduled or prn medications/treatments as ordered  Take Vital Signs  Increase Vital Sign Frequency  Yellow: Q2hr x1, continue Q4hrs until patient remains green for 12hrs  Escalate  MEWS: Escalate Yellow: Discuss with charge nurse and consider notifying provider and/or RRT  Notify: Charge Nurse/RN  Name of Charge Nurse/RN Notified [facility]  Provider Notification  Provider Name/Title Dr. Granville Lewis  Date Provider Notified 07/18/23  Time Provider Notified 0032  Method of Notification Page  Notification Reason Other (Comment) (Elevated BP)  Provider response Other (Comment) (ordered to increase NTG gtt)  Date of Provider Response 07/18/23  Time of Provider Response 0112  Assess: SIRS CRITERIA  SIRS Temperature  0  SIRS Respirations  0  SIRS Pulse 0  SIRS WBC 0  SIRS Score Sum  0

## 2023-07-18 NOTE — Progress Notes (Signed)
   Patient Name: Chad Gordon Date of Encounter: 07/18/2023 [facility] Cardiologist: None   Interval Summary  .    No chest pain this morning. Wants to go home and come back for surgery.   Vital Signs .    Vitals:   07/18/23 0027 07/18/23 0127 07/18/23 0231 07/18/23 0500  BP: (!) 203/98 (!) 166/63 137/67 (!) 153/76  Pulse: 70 68 68   Resp: 18   18  Temp:    98 F (36.7 C)  TempSrc:    Oral  SpO2: 97% 96% 96%   Weight:      Height:        Intake/Output Summary (Last 24 hours) at 07/18/2023 0710 Last data filed at 07/18/2023 0400 Gross per 24 hour  Intake 1356.19 ml  Output 1170 ml  Net 186.19 ml      07/17/2023    8:33 AM 07/10/2023    3:54 PM 06/28/2023    8:12 AM  Last 3 Weights  Weight (lbs) 226 lb 226 lb 9.6 oz 223 lb 9.6 oz  Weight (kg) 102.513 kg 102.785 kg 101.424 kg      Telemetry/ECG    Sinus rhythm - Personally Reviewed  Physical Exam .   GEN: No acute distress.   Neck: No JVD Cardiac: RRR, no murmurs, rubs, or gallops.  Respiratory: Clear to auscultation bilaterally. GI: Soft, nontender, non-distended  MS: bilateral UE amp, right groin cath site stable.   Assessment & Plan .     77 yo male with PMH of HTN, HLD, GERD, PE post back surgery (completed 6 month course), elevated coronary calcium score who presented for outpatient cardiac cath.   CAD -- presented for outpatient cardiac cath and found to have severe dLM-oLAD of 70% involving D1/RI and small caliber Lcx. Admitted with consult to TCTS for CABG evaluation. Pending MD evaluation. Echo showed LVEF of 60-65%, normal RV, no significant valvular disease. No recurrent chest pain. Wants to go home and come back for surgery if possible.  -- on IV NTG, ASA, statin   HTN -- poorly controlled -- currently on IV NTG, will switch to bystolic to coreg 25mg  BID, continue ARB and add norvasc   HLD -- LP(a) pending -- continue Crestor 40mg  daily  For questions or updates, please  contact [facility] Please consult [email or web address] for contact info under        Signed, Laverda Page, NP

## 2023-07-18 NOTE — Progress Notes (Signed)
Mobility Specialist Progress Note:    07/18/23 1400  Mobility  Activity Ambulated with assistance in hallway  Level of Assistance Contact guard assist, steadying assist  Assistive Device None  Distance Ambulated (ft) 400 ft  Activity Response Tolerated well  Mobility Referral Yes  Mobility visit 1 Mobility  Mobility Specialist Start Time (ACUTE ONLY) 1419  Mobility Specialist Stop Time (ACUTE ONLY) 1426  Mobility Specialist Time Calculation (min) (ACUTE ONLY) 7 min   Received pt in bed having no complaints and agreeable to mobility. Pt was asymptomatic throughout ambulation and returned to room w/o fault. Left in bed w/ call bell in reach and all needs met.   D'Vante Earlene Plater Mobility Specialist Please contact via Special educational needs teacher or Rehab office at [phone]

## 2023-07-19 DIAGNOSIS — I2510 Atherosclerotic heart disease of native coronary artery without angina pectoris: Secondary | ICD-10-CM | POA: Diagnosis not present

## 2023-07-19 LAB — BASIC METABOLIC PANEL
Anion gap: 6 (ref 5–15)
BUN: 12 mg/dL (ref 8–23)
CO2: 25 mmol/L (ref 22–32)
Calcium: 9.4 mg/dL (ref 8.9–10.3)
Chloride: 106 mmol/L (ref 98–111)
Creatinine, Ser: 0.93 mg/dL (ref 0.61–1.24)
GFR, Estimated: >60 mL/min (ref >60)
Glucose, Bld: 97 mg/dL (ref 70–99)
Potassium: 4 mmol/L (ref 3.5–5.1)
Sodium: 137 mmol/L (ref 135–145)

## 2023-07-19 MED ORDER — MENTHOL 3 MG MT LOZG
1.0000 | LOZENGE | OROMUCOSAL | Status: DC | PRN
  Filled 2023-07-19: qty 9

## 2023-07-19 MED ORDER — AMLODIPINE BESYLATE 5 MG PO TABS
5.0000 mg | ORAL_TABLET | Freq: Once | ORAL | Status: AC
  Administered 2023-07-19: 5 mg via ORAL
  Filled 2023-07-19: qty 1

## 2023-07-19 MED ORDER — HYDRALAZINE HCL 25 MG PO TABS
25.0000 mg | ORAL_TABLET | Freq: Three times a day (TID) | ORAL | Status: DC
  Administered 2023-07-19 – 2023-07-22 (×10): 25 mg via ORAL
  Filled 2023-07-19 (×10): qty 1

## 2023-07-19 MED ORDER — AMLODIPINE BESYLATE 10 MG PO TABS
10.0000 mg | ORAL_TABLET | Freq: Every day | ORAL | Status: DC
  Administered 2023-07-20 – 2023-07-23 (×4): 10 mg via ORAL
  Filled 2023-07-19 (×4): qty 1

## 2023-07-19 NOTE — Care Management Important Message (Signed)
Important Message  Patient Details  Name: Chad Gordon MRN: [id number] Date of Birth: [birth year]   Important Message Given:  Yes - Medicare IM     Renie Ora 07/19/2023, 10:03 AM

## 2023-07-19 NOTE — Progress Notes (Signed)
   Patient Name: Chad Gordon Date of Encounter: 07/19/2023 [facility] Cardiologist: None   Interval Summary  .    No chest pain, has been up ambulating in the hallway.   Vital Signs .    Vitals:   07/18/23 2226 07/19/23 0227 07/19/23 0423 07/19/23 0541  BP: (!) 144/74 (!) 153/74 (!) 176/83 (!) 164/88  Pulse: 71 (!) 58 65 66  Resp:   18   Temp:   97.6 F (36.4 C)   TempSrc:   Oral   SpO2: 97% 95% 98% 99%  Weight:      Height:        Intake/Output Summary (Last 24 hours) at 07/19/2023 0901 Last data filed at 07/19/2023 0805 Gross per 24 hour  Intake 943.9 ml  Output 1150 ml  Net -206.1 ml      07/17/2023    8:33 AM 07/10/2023    3:54 PM 06/28/2023    8:12 AM  Last 3 Weights  Weight (lbs) 226 lb 226 lb 9.6 oz 223 lb 9.6 oz  Weight (kg) 102.513 kg 102.785 kg 101.424 kg      Telemetry/ECG    Sinus Rhythm - Personally Reviewed  Physical Exam .    GEN: No acute distress.   Neck: No JVD Cardiac: RRR, no murmurs, rubs, or gallops.  Respiratory: Clear to auscultation bilaterally. GI: Soft, nontender, non-distended  MS: bilateral UE amp at elbows, right groin site stable.   Assessment & Plan .     77 yo male with PMH of HTN, HLD, GERD, PE post back surgery (completed 6 month course), elevated coronary calcium score who presented for outpatient cardiac cath.    CAD -- presented for outpatient cardiac cath and found to have severe dLM-oLAD of 70% involving D1/RI and small caliber Lcx. Admitted with consult to TCTS for CABG evaluation. Pending MD evaluation. Echo showed LVEF of 60-65%, normal RV, no significant valvular disease. No recurrent chest pain. Wants to go home and come back for surgery if possible.  -- pre CABG dopplers pending -- on IV NTG, ASA, statin, coreg, avapro    HTN -- poorly controlled prior to admission -- remains on IV NTG, was switched from bystolic to coreg 25mg  BID yesterday. Continue avapro, increase norvasc to 10mg  daily, add  hydralazine 25mg  TID. Wean IV NTG   HLD -- LP(a) pending -- continue Crestor 40mg  daily  For questions or updates, please contact [facility] Please consult [email or web address] for contact info under        Signed, Laverda Page, NP

## 2023-07-20 ENCOUNTER — Inpatient Hospital Stay (HOSPITAL_COMMUNITY): Payer: Medicare Other

## 2023-07-20 DIAGNOSIS — Z01810 Encounter for preprocedural cardiovascular examination: Secondary | ICD-10-CM

## 2023-07-20 DIAGNOSIS — I2510 Atherosclerotic heart disease of native coronary artery without angina pectoris: Secondary | ICD-10-CM | POA: Diagnosis not present

## 2023-07-20 LAB — LIPOPROTEIN A (LPA): Lipoprotein (a): <8.4 nmol/L (ref <75.0)

## 2023-07-20 MED ORDER — MAGNESIUM HYDROXIDE 400 MG/5ML PO SUSP
30.0000 mL | Freq: Every day | ORAL | Status: DC | PRN
  Administered 2023-07-20: 30 mL via ORAL
  Filled 2023-07-20 (×2): qty 30

## 2023-07-20 NOTE — Progress Notes (Signed)
Mobility Specialist Progress Note:    07/20/23 1400  Mobility  Activity Ambulated independently in hallway  Level of Assistance Standby assist, set-up cues, supervision of patient - no hands on  Assistive Device None  Distance Ambulated (ft) 100 ft  Activity Response Tolerated well  Mobility Referral Yes  Mobility visit 1 Mobility  Mobility Specialist Start Time (ACUTE ONLY) 1403  Mobility Specialist Stop Time (ACUTE ONLY) 1409  Mobility Specialist Time Calculation (min) (ACUTE ONLY) 6 min   Pt received in bed, agreeable to mobility. Asymptomatic throughout w/ no complaints. Denied any chest pain or SOB. Pt left in bed with call bell and all needs met.  D'Vante Earlene Plater Mobility Specialist Please contact via Special educational needs teacher or Rehab office at [phone]

## 2023-07-20 NOTE — Progress Notes (Signed)
VASCULAR LAB    Pre CABG Dopplers have been performed.  See CV proc for preliminary results.   Emerald Gehres, RVT 07/20/2023, 10:18 AM

## 2023-07-20 NOTE — Plan of Care (Signed)
  Problem: Education: Goal: Understanding of CV disease, CV risk reduction, and recovery process will improve Outcome: Progressing   Problem: Clinical Measurements: Goal: Ability to maintain clinical measurements within normal limits will improve Outcome: Progressing Goal: Will remain free from infection Outcome: Progressing Goal: Diagnostic test results will improve Outcome: Progressing Goal: Respiratory complications will improve Outcome: Progressing Goal: Cardiovascular complication will be avoided Outcome: Progressing

## 2023-07-20 NOTE — Progress Notes (Signed)
   Rounding Note    Patient Name: Chad Gordon Date of Encounter: 07/20/2023  [facility] Cardiologist: None   Subjective   Intermittent angina overnight.  Vital Signs    Vitals:   07/19/23 1244 07/19/23 1920 07/20/23 0524 07/20/23 0659  BP: 102/69 127/60 (!) 163/75 (!) 142/72  Pulse:  67 74   Resp: 18 16 20    Temp: 98 F (36.7 C) 98 F (36.7 C) 97.9 F (36.6 C)   TempSrc: Oral Oral Oral   SpO2:  98% 99%   Weight:      Height:        Intake/Output Summary (Last 24 hours) at 07/20/2023 0827 Last data filed at 07/20/2023 0617 Gross per 24 hour  Intake 720 ml  Output 950 ml  Net -230 ml      07/17/2023    8:33 AM 07/10/2023    3:54 PM 06/28/2023    8:12 AM  Last 3 Weights  Weight (lbs) 226 lb 226 lb 9.6 oz 223 lb 9.6 oz  Weight (kg) 102.513 kg 102.785 kg 101.424 kg      Telemetry    Personally Reviewed  ECG    Personally Reviewed  Physical Exam   GEN: No acute distress.   Cardiac: RRR, no murmurs, rubs, or gallops.  Respiratory: Clear to auscultation bilaterally. Psych: Normal affect   Assessment & Plan    #CAD #Unstable angina Continues to have episodes of CP. Normal EF. Awaiting surgical consultation. Cont IV NTG, aspirin, statin, coreg, avapro  #HTN Cont coreg, avapro, norvasc, hydralazine, NTG  #HLD Crestor      Chad Neal T. Lalla Brothers, MD, [facility], [facility] Cardiac Electrophysiology

## 2023-07-21 DIAGNOSIS — I2510 Atherosclerotic heart disease of native coronary artery without angina pectoris: Secondary | ICD-10-CM | POA: Diagnosis not present

## 2023-07-21 LAB — LIPID PANEL
Cholesterol: 103 mg/dL (ref 0–200)
HDL: 41 mg/dL (ref >40)
LDL Cholesterol: 47 mg/dL (ref 0–99)
Total CHOL/HDL Ratio: 2.5 {ratio}
Triglycerides: 73 mg/dL (ref <150)
VLDL: 15 mg/dL (ref 0–40)

## 2023-07-21 LAB — SURGICAL PCR SCREEN
MRSA, PCR: NEGATIVE
Staphylococcus aureus: NEGATIVE

## 2023-07-21 LAB — HEMOGLOBIN A1C
Hgb A1c MFr Bld: 5.4 % (ref 4.8–5.6)
Mean Plasma Glucose: 108.28 mg/dL

## 2023-07-21 LAB — PROTIME-INR
INR: 1.1 (ref 0.8–1.2)
Prothrombin Time: 14 s (ref 11.4–15.2)

## 2023-07-21 LAB — APTT: aPTT: 29 s (ref 24–36)

## 2023-07-21 NOTE — Progress Notes (Signed)
   Rounding Note    Patient Name: Chad Gordon Date of Encounter: 07/21/2023  [facility] Cardiologist: None   Subjective   NAEO  Vital Signs    Vitals:   07/20/23 1645 07/20/23 2003 07/21/23 0600 07/21/23 0938  BP: (!) 143/71 (!) 142/62 (!) 156/75 120/63  Pulse: 79 76 67   Resp:  19 15   Temp:  98.1 F (36.7 C) 98 F (36.7 C)   TempSrc:  Oral Oral   SpO2:  92% 98%   Weight:      Height:        Intake/Output Summary (Last 24 hours) at 07/21/2023 1048 Last data filed at 07/21/2023 1002 Gross per 24 hour  Intake --  Output 550 ml  Net -550 ml      07/17/2023    8:33 AM 07/10/2023    3:54 PM 06/28/2023    8:12 AM  Last 3 Weights  Weight (lbs) 226 lb 226 lb 9.6 oz 223 lb 9.6 oz  Weight (kg) 102.513 kg 102.785 kg 101.424 kg      Telemetry    Personally Reviewed  ECG    Personally Reviewed  Physical Exam   GEN: No acute distress.   Cardiac: RRR, no murmurs, rubs, or gallops.  Respiratory: Clear to auscultation bilaterally. Psych: Normal affect   Assessment & Plan    #CAD #Unstable angina Continues to have episodes of CP. Normal EF. Awaiting CTS. Cont IV NTG, aspirin, statin, coreg, avapro  #HTN Cont coreg, avapro, norvasc, hydralazine, NTG  #HLD Crestor      Latham Kinzler T. Lalla Brothers, MD, [facility], [facility] Cardiac Electrophysiology

## 2023-07-22 ENCOUNTER — Inpatient Hospital Stay (HOSPITAL_COMMUNITY): Payer: Medicare Other

## 2023-07-22 DIAGNOSIS — I2510 Atherosclerotic heart disease of native coronary artery without angina pectoris: Secondary | ICD-10-CM | POA: Diagnosis not present

## 2023-07-22 LAB — URINALYSIS, ROUTINE W REFLEX MICROSCOPIC
Bilirubin Urine: NEGATIVE
Glucose, UA: NEGATIVE mg/dL
Hgb urine dipstick: NEGATIVE
Ketones, ur: 5 mg/dL — AB
Leukocytes,Ua: NEGATIVE
Nitrite: NEGATIVE
Protein, ur: NEGATIVE mg/dL
Specific Gravity, Urine: 1.021 (ref 1.005–1.030)
pH: 5 (ref 5.0–8.0)

## 2023-07-22 LAB — BASIC METABOLIC PANEL
Anion gap: 12 (ref 5–15)
BUN: 22 mg/dL (ref 8–23)
CO2: 19 mmol/L — ABNORMAL LOW (ref 22–32)
Calcium: 9.3 mg/dL (ref 8.9–10.3)
Chloride: 106 mmol/L (ref 98–111)
Creatinine, Ser: 0.99 mg/dL (ref 0.61–1.24)
GFR, Estimated: >60 mL/min (ref >60)
Glucose, Bld: 94 mg/dL (ref 70–99)
Potassium: 4.3 mmol/L (ref 3.5–5.1)
Sodium: 137 mmol/L (ref 135–145)

## 2023-07-22 LAB — BLOOD GAS, ARTERIAL
Acid-Base Excess: 0.2 mmol/L (ref 0.0–2.0)
Bicarbonate: 25.4 mmol/L (ref 20.0–28.0)
O2 Saturation: 97.9 %
Patient temperature: 36.5
pCO2 arterial: 41 mm[Hg] (ref 32–48)
pH, Arterial: 7.4 (ref 7.35–7.45)
pO2, Arterial: 78 mm[Hg] — ABNORMAL LOW (ref 83–108)

## 2023-07-22 MED ORDER — HYDRALAZINE HCL 25 MG PO TABS
25.0000 mg | ORAL_TABLET | Freq: Two times a day (BID) | ORAL | Status: DC
  Administered 2023-07-22 – 2023-07-23 (×3): 25 mg via ORAL
  Filled 2023-07-22 (×3): qty 1

## 2023-07-22 NOTE — Plan of Care (Signed)
  Problem: Education: Goal: Understanding of CV disease, CV risk reduction, and recovery process will improve Outcome: Progressing   Problem: Health Behavior/Discharge Planning: Goal: Ability to safely manage health-related needs after discharge will improve Outcome: Progressing   Problem: Education: Goal: Knowledge of General Education information will improve Description: Including pain rating scale, medication(s)/side effects and non-pharmacologic comfort measures Outcome: Progressing   Problem: Activity: Goal: Risk for activity intolerance will decrease Outcome: Progressing   Problem: Coping: Goal: Level of anxiety will decrease Outcome: Progressing   Problem: Clinical Measurements: Goal: Ability to maintain clinical measurements within normal limits will improve Outcome: Completed/Met   Problem: Nutrition: Goal: Adequate nutrition will be maintained Outcome: Completed/Met   Problem: Elimination: Goal: Will not experience complications related to bowel motility Outcome: Completed/Met   Problem: Pain Management: Goal: General experience of comfort will improve Outcome: Completed/Met

## 2023-07-22 NOTE — Progress Notes (Signed)
CARDIAC REHAB PHASE I      Pre-op OHS education including OHS booklet, OHS handout, IS use, mobility importance, home needs at discharge and sternal precautions/move in the tube reviewed. All questions and concerns addressed. Will continue to follow.  [id number] Woodroe Chen, RN BSN 07/22/2023 9:50 AM

## 2023-07-22 NOTE — TOC Initial Note (Addendum)
Transition of Care [facility]) - Initial/Assessment Note    Patient Details  Name: Chad Gordon MRN: [id number] Date of Birth: [birth year]  Transition of Care [facility]) CM/SW Contact:    Gala Lewandowsky, RN Phone Number: 07/22/2023, 4:37 PM  Clinical Narrative: Patient presented for chest pain- plan for CABG 07-24-23. PTA patient was from home alone; spouse is in a rehab facility. Patient states son will be visiting from [place] and staying with him post surgery then will switch out to another son. Patient has DME rolling walker at home. Case Manager will continue to follow the patient post CABG for home needs.                      Expected Discharge Plan: Home w Home Health Services Barriers to Discharge: Continued Medical Work up   Patient Goals and CMS Choice Patient states their goals for this hospitalization and ongoing recovery are:: to return home once stable.   Expected Discharge Plan and Services   Discharge Planning Services: CM Consult Post Acute Care Choice: Home Health Living arrangements for the past 2 months: Apartment  Prior Living Arrangements/Services Living arrangements for the past 2 months: Apartment Lives with:: Self (spouse is in a rehab facility has support of sons.) Patient language and need for interpreter reviewed:: Yes Do you feel safe going back to the place where you live?: Yes      Need for Family Participation in Patient Care: Yes (Comment) Care giver support system in place?: Yes (comment) Current home services: DME (rolling walker) Criminal Activity/Legal Involvement Pertinent to Current Situation/Hospitalization: No - Comment as needed  Activities of Daily Living   ADL Screening (condition at time of admission) Independently performs ADLs?: No Does the patient have a NEW difficulty with bathing/dressing/toileting/self-feeding that is expected to last >3 days?: No Does the patient have a NEW difficulty with getting in/out of bed,  walking, or climbing stairs that is expected to last >3 days?: No Does the patient have a NEW difficulty with communication that is expected to last >3 days?: No Is the patient deaf or have difficulty hearing?: No Does the patient have difficulty seeing, even when wearing glasses/contacts?: No Does the patient have difficulty concentrating, remembering, or making decisions?: No  Permission Sought/Granted Permission sought to share information with : Family Supports, Case Manager    Emotional Assessment Appearance:: Appears stated age Attitude/Demeanor/Rapport: Engaged Affect (typically observed): Appropriate Orientation: : Oriented to Self, Oriented to Place, Oriented to  Time, Oriented to Situation Alcohol / Substance Use: Not Applicable Psych Involvement: No (comment)  Admission diagnosis:  Coronary artery disease involving left main coronary artery [I25.10] Patient Active Problem List   Diagnosis Date Noted   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  CAD RADS 4 study with severe stenosis of left main and proximal LAD.  Mild stenosis in RCA and ramus intermedius branch.  LCx is a small artery 07/10/2023   Chest pain on exertion 06/28/2023   Diarrhea of presumed infectious origin 06/25/2023   Precordial pain 06/25/2023   Abdominal discomfort 03/25/2023   Alteration of body temperature 11/20/2022   Acute pulmonary embolism with acute cor pulmonale (HCC) 11/03/2022   Surgical wound present 10/16/2022   Abnormality of gait and mobility 10/11/2022   Alteration in self-care ability 10/11/2022   Complication of surgical procedure 10/10/2022   Loose stools 09/25/2022   Benign prostatic hyperplasia with weak urinary stream 05/22/2022   Other spondylosis with  radiculopathy, lumbar region 11/24/2021   Preoperative evaluation to rule out surgical contraindication 11/07/2021   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   Achilles tendinitis of right lower  extremity 12/13/2020   Arthritis of right ankle 12/13/2020   Spinal stenosis, lumbar region with neurogenic claudication 11/08/2020   COVID-19 determined by clinical diagnostic criteria 08/26/2020   Atherosclerosis of both carotid arteries 09/08/2019   Atherosclerosis of vertebral artery 09/08/2019   Thoracic aortic atherosclerosis (HCC) 09/08/2019   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 03/18/2018   Muscle strain of gluteal region, right, initial encounter 03/12/2018   Family history of stroke 01/30/2018   Cervical radiculopathy 01/07/2018   Claustrophobia 01/07/2018   Vertigo 07/24/2017   Neuritis 06/11/2017   Other constipation 03/30/2016   Gastroesophageal reflux disease without esophagitis 10/20/2015   Mixed hyperlipidemia 10/20/2015   S/P total knee arthroplasty 09/28/2013   Chronic rhinitis 11/17/2012   SOB (shortness of breath) 10/06/2012   Hypertension 10/06/2012   PCP:  Olive Bass, MD Pharmacy:   [facility] - [place], [place] - [address] [place] [address] Phone: [phone] Fax: [phone]  Social Drivers of Health (SDOH) Social History: SDOH Screenings   Food Insecurity: No Food Insecurity (07/17/2023)  Housing: Low Risk  (07/18/2023)  Transportation Needs: No Transportation Needs (07/17/2023)  Utilities: Not At Risk (07/17/2023)  Tobacco Use: Medium Risk (07/17/2023)   Readmission Risk Interventions     No data to display

## 2023-07-22 NOTE — Progress Notes (Addendum)
   Patient Name: Chad Gordon Date of Encounter: 07/22/2023 [facility] Cardiologist: None   Interval Summary  .    Patient resting comfortably this morning. Denies any chest pain since this past Friday. That discomfort occurred with increased exertion. As long as patient limited physical activity, he has no pain. Denies other focal symptoms today including dyspnea.   Vital Signs .    Vitals:   07/21/23 1957 07/21/23 2136 07/22/23 0447 07/22/23 0845  BP: (!) 100/59 (!) 144/64 (!) 149/67 125/64  Pulse: 66  76 71  Resp: 18  16 16   Temp: 98 F (36.7 C)  98.2 F (36.8 C) 98.1 F (36.7 C)  TempSrc: Oral  Oral Oral  SpO2: 99%  98% 100%  Weight:      Height:        Intake/Output Summary (Last 24 hours) at 07/22/2023 0850 Last data filed at 07/21/2023 2138 Gross per 24 hour  Intake 3 ml  Output 200 ml  Net -197 ml      07/17/2023    8:33 AM 07/10/2023    3:54 PM 06/28/2023    8:12 AM  Last 3 Weights  Weight (lbs) 226 lb 226 lb 9.6 oz 223 lb 9.6 oz  Weight (kg) 102.513 kg 102.785 kg 101.424 kg      Telemetry/ECG    Sinus rhythm - Personally Reviewed  Physical Exam .   GEN: No acute distress.   Neck: No JVD Cardiac: RRR, no murmurs, rubs, or gallops.  Respiratory: Clear to auscultation bilaterally. GI: Soft, nontender, non-distended  MS: No edema  Assessment & Plan .     CAD Patient presented for outpatient cardiac cath and was found to have severe dLM-oLAD of 70% involving D1/RI and small caliber Lcx. Admitted with consult to TCTS for CABG evaluation. Echo showed LVEF of 60-65%, normal RV, no significant valvular disease. Following admission, patient with intermittent anginal symptoms.   No more chest pain this admission, off NTG. Continue ASA, Coreg 25mg  BID, Rosuvastatin 40mg  Pending CABG with Dr. Cliffton Asters on 12/18.    HTN  BP poorly controlled prior to admission. Was on IV NTG, now off. He was switched from bystolic to coreg 25mg  BID this  admission.   BP much improved today.  Continue avapro, Coreg, Norvasc to 10mg  daily, hydralazine 25mg  TID. Long term would prefer hydrochlorothiazide over hydralazine. If BMP shows stable renal function this morning, would consider switching to 12.5mg  hydrochlorothiazide, especially with ARB needing to be held pre CABG starting 12/17.     HLD -- LP(a) pending -- continue Crestor 40mg  daily  For questions or updates, please contact [facility] Please consult [email or web address] for contact info under        Signed, Perlie Gold, PA-C    ATTENDING ATTESTATION:  After conducting a review of all available clinical information with the care team, interviewing the patient, and performing a physical exam, I agree with the findings and plan described in this note.   GEN: No acute distress.   HEENT:  MMM, no JVD, no scleral icterus Cardiac: RRR, no murmurs, rubs, or gallops.  Respiratory: Clear to auscultation bilaterally. GI: Soft, nontender, non-distended  MS: No edema; No deformity. Neuro:  Nonfocal  Vasc:  Bilateral UE amputations  Patient remains stable with slight dyspnea at times.  OR 12/18 for CABG.  Continue current therapy.   Alverda Skeans, MD Pager [phone]

## 2023-07-22 NOTE — Progress Notes (Signed)
ABG collected and sent to the lab 

## 2023-07-22 NOTE — Progress Notes (Signed)
Mobility Specialist Progress Note:    07/22/23 1200  Mobility  Activity Refused mobility  Mobility Specialist Start Time (ACUTE ONLY) 1201   Pt refused mobility, stating he felt some chest tightness when ambulating in room earlier. Will f/u as able.   Chad Gordon Mobility Specialist Please contact via Special educational needs teacher or Rehab office at [phone]

## 2023-07-22 NOTE — Progress Notes (Signed)
  Transition of Care [facility]) Screening Note   Patient Details  Name: Chad Gordon Date of Birth: 12-14-1945   Transition of Care [facility]) CM/SW Contact:    Delilah Shan, LCSWA Phone Number: 07/22/2023, 3:08 PM    Transition of Care Department [facility]) has reviewed patient and no TOC needs have been identified at this time. We will continue to monitor patient advancement through interdisciplinary progression rounds. If new patient transition needs arise, please place a TOC consult.

## 2023-07-23 DIAGNOSIS — I200 Unstable angina: Secondary | ICD-10-CM

## 2023-07-23 LAB — TYPE AND SCREEN
ABO/RH(D): A NEG
Antibody Screen: NEGATIVE

## 2023-07-23 LAB — BASIC METABOLIC PANEL
Anion gap: 7 (ref 5–15)
BUN: 26 mg/dL — ABNORMAL HIGH (ref 8–23)
CO2: 22 mmol/L (ref 22–32)
Calcium: 9.1 mg/dL (ref 8.9–10.3)
Chloride: 105 mmol/L (ref 98–111)
Creatinine, Ser: 1.14 mg/dL (ref 0.61–1.24)
GFR, Estimated: >60 mL/min (ref >60)
Glucose, Bld: 96 mg/dL (ref 70–99)
Potassium: 3.8 mmol/L (ref 3.5–5.1)
Sodium: 134 mmol/L — ABNORMAL LOW (ref 135–145)

## 2023-07-23 MED ORDER — TRANEXAMIC ACID (OHS) BOLUS VIA INFUSION
15.0000 mg/kg | INTRAVENOUS | Status: AC
  Administered 2023-07-24: 1537.5 mg via INTRAVENOUS
  Filled 2023-07-23: qty 1538

## 2023-07-23 MED ORDER — TEMAZEPAM 15 MG PO CAPS
15.0000 mg | ORAL_CAPSULE | Freq: Once | ORAL | Status: DC | PRN
Start: 2023-07-23 — End: 2023-07-24

## 2023-07-23 MED ORDER — CEFAZOLIN SODIUM-DEXTROSE 2-4 GM/100ML-% IV SOLN
2.0000 g | INTRAVENOUS | Status: AC
  Administered 2023-07-24: 2 g via INTRAVENOUS
  Filled 2023-07-23: qty 100

## 2023-07-23 MED ORDER — EPINEPHRINE HCL 5 MG/250ML IV SOLN IN NS
0.0000 ug/min | INTRAVENOUS | Status: DC
  Filled 2023-07-23: qty 250

## 2023-07-23 MED ORDER — DEXMEDETOMIDINE HCL IN NACL 400 MCG/100ML IV SOLN
0.1000 ug/kg/h | INTRAVENOUS | Status: AC
  Administered 2023-07-24: .4 ug/kg/h via INTRAVENOUS
  Filled 2023-07-23: qty 100

## 2023-07-23 MED ORDER — PHENYLEPHRINE HCL-NACL 20-0.9 MG/250ML-% IV SOLN
30.0000 ug/min | INTRAVENOUS | Status: AC
  Administered 2023-07-24: 40 ug/min via INTRAVENOUS
  Filled 2023-07-23: qty 250

## 2023-07-23 MED ORDER — MILRINONE LACTATE IN DEXTROSE 20-5 MG/100ML-% IV SOLN
0.3000 ug/kg/min | INTRAVENOUS | Status: DC
  Filled 2023-07-23: qty 100

## 2023-07-23 MED ORDER — MANNITOL 20 % IV SOLN
INTRAVENOUS | Status: DC
  Filled 2023-07-23: qty 13

## 2023-07-23 MED ORDER — PLASMA-LYTE A IV SOLN
INTRAVENOUS | Status: DC
  Filled 2023-07-23: qty 5

## 2023-07-23 MED ORDER — METOPROLOL TARTRATE 12.5 MG HALF TABLET
12.5000 mg | ORAL_TABLET | Freq: Once | ORAL | Status: AC
  Administered 2023-07-24: 12.5 mg via ORAL
  Filled 2023-07-23: qty 1

## 2023-07-23 MED ORDER — NOREPINEPHRINE 4 MG/250ML-% IV SOLN
0.0000 ug/min | INTRAVENOUS | Status: DC
  Filled 2023-07-23: qty 250

## 2023-07-23 MED ORDER — POTASSIUM CHLORIDE 2 MEQ/ML IV SOLN
80.0000 meq | INTRAVENOUS | Status: DC
  Filled 2023-07-23: qty 40

## 2023-07-23 MED ORDER — CHLORHEXIDINE GLUCONATE 0.12 % MT SOLN
15.0000 mL | Freq: Once | OROMUCOSAL | Status: AC
  Administered 2023-07-24: 15 mL via OROMUCOSAL
  Filled 2023-07-23: qty 15

## 2023-07-23 MED ORDER — PLASMA-LYTE A IV SOLN
INTRAVENOUS | Status: DC
  Filled 2023-07-23: qty 2.5

## 2023-07-23 MED ORDER — VANCOMYCIN HCL 1.5 G IV SOLR
1500.0000 mg | INTRAVENOUS | Status: AC
  Administered 2023-07-24: 1500 mg via INTRAVENOUS
  Filled 2023-07-23: qty 30

## 2023-07-23 MED ORDER — CHLORHEXIDINE GLUCONATE CLOTH 2 % EX PADS
6.0000 | MEDICATED_PAD | Freq: Once | CUTANEOUS | Status: AC
  Administered 2023-07-24: 6 via TOPICAL

## 2023-07-23 MED ORDER — TRANEXAMIC ACID 1000 MG/10ML IV SOLN
1.5000 mg/kg/h | INTRAVENOUS | Status: AC
  Administered 2023-07-24: 1.5 mg/kg/h via INTRAVENOUS
  Filled 2023-07-23: qty 25

## 2023-07-23 MED ORDER — CHLORHEXIDINE GLUCONATE CLOTH 2 % EX PADS
6.0000 | MEDICATED_PAD | Freq: Once | CUTANEOUS | Status: AC
  Administered 2023-07-23: 6 via TOPICAL

## 2023-07-23 MED ORDER — TRANEXAMIC ACID (OHS) PUMP PRIME SOLUTION
2.0000 mg/kg | INTRAVENOUS | Status: DC
  Filled 2023-07-23: qty 2.05

## 2023-07-23 MED ORDER — NITROGLYCERIN IN D5W 200-5 MCG/ML-% IV SOLN
2.0000 ug/min | INTRAVENOUS | Status: DC
  Filled 2023-07-23: qty 250

## 2023-07-23 MED ORDER — INSULIN REGULAR(HUMAN) IN NACL 100-0.9 UT/100ML-% IV SOLN
INTRAVENOUS | Status: DC
  Filled 2023-07-23: qty 100

## 2023-07-23 MED ORDER — HEPARIN 30,000 UNITS/1000 ML (OHS) CELLSAVER SOLUTION
Status: DC
  Filled 2023-07-23: qty 1000

## 2023-07-23 MED ORDER — BISACODYL 5 MG PO TBEC: 5.0000 mg | DELAYED_RELEASE_TABLET | Freq: Once | ORAL | Status: DC

## 2023-07-23 NOTE — Progress Notes (Signed)
CARDIAC REHAB PHASE I   Pt pre-op OHS education completed 12-16. Ambulated in hallway with mobility team tolerated well. Will continue to follow.   Woodroe Chen, RN BSN 07/23/2023 11:04 AM

## 2023-07-23 NOTE — Plan of Care (Signed)
  Problem: Education: Goal: Understanding of CV disease, CV risk reduction, and recovery process will improve Outcome: Progressing Goal: Individualized Educational Video(s) Outcome: Progressing   Problem: Cardiovascular: Goal: Ability to achieve and maintain adequate cardiovascular perfusion will improve Outcome: Progressing   Problem: Health Behavior/Discharge Planning: Goal: Ability to safely manage health-related needs after discharge will improve Outcome: Progressing   Problem: Education: Goal: Knowledge of General Education information will improve Description: Including pain rating scale, medication(s)/side effects and non-pharmacologic comfort measures Outcome: Progressing   Problem: Health Behavior/Discharge Planning: Goal: Ability to manage health-related needs will improve Outcome: Progressing   Problem: Clinical Measurements: Goal: Will remain free from infection Outcome: Progressing Goal: Diagnostic test results will improve Outcome: Progressing Goal: Cardiovascular complication will be avoided Outcome: Progressing   Problem: Activity: Goal: Risk for activity intolerance will decrease Outcome: Progressing   Problem: Coping: Goal: Level of anxiety will decrease Outcome: Progressing   Problem: Elimination: Goal: Will not experience complications related to urinary retention Outcome: Progressing   Problem: Safety: Goal: Ability to remain free from injury will improve Outcome: Progressing   Problem: Skin Integrity: Goal: Risk for impaired skin integrity will decrease Outcome: Progressing   Problem: Education: Goal: Understanding of cardiac disease, CV risk reduction, and recovery process will improve Outcome: Progressing Goal: Individualized Educational Video(s) Outcome: Progressing   Problem: Activity: Goal: Ability to tolerate increased activity will improve Outcome: Progressing   Problem: Cardiac: Goal: Ability to achieve and maintain adequate  cardiovascular perfusion will improve Outcome: Progressing   Problem: Health Behavior/Discharge Planning: Goal: Ability to safely manage health-related needs after discharge will improve Outcome: Progressing   Problem: Education: Goal: Will demonstrate proper wound care and an understanding of methods to prevent future damage Outcome: Progressing Goal: Knowledge of disease or condition will improve Outcome: Progressing Goal: Knowledge of the prescribed therapeutic regimen will improve Outcome: Progressing Goal: Individualized Educational Video(s) Outcome: Progressing   Problem: Activity: Goal: Risk for activity intolerance will decrease Outcome: Progressing   Problem: Cardiac: Goal: Will achieve and/or maintain hemodynamic stability Outcome: Progressing   Problem: Clinical Measurements: Goal: Postoperative complications will be avoided or minimized Outcome: Progressing   Problem: Respiratory: Goal: Respiratory status will improve Outcome: Progressing   Problem: Skin Integrity: Goal: Wound healing without signs and symptoms of infection Outcome: Progressing Goal: Risk for impaired skin integrity will decrease Outcome: Progressing   Problem: Urinary Elimination: Goal: Ability to achieve and maintain adequate renal perfusion and functioning will improve Outcome: Progressing

## 2023-07-23 NOTE — Progress Notes (Addendum)
   Patient Name: Chad Gordon Date of Encounter: 07/23/2023 [facility] Cardiologist: None   Interval Summary  .    No chest pain, planned for CAGB tomorrow  Vital Signs .    Vitals:   07/22/23 1742 07/22/23 2043 07/23/23 0612 07/23/23 0742  BP: 129/67 122/61 137/74 132/62  Pulse: 68 68 71 68  Resp: 16 17 17 16   Temp: 97.7 F (36.5 C) 98.1 F (36.7 C) 97.6 F (36.4 C) 97.8 F (36.6 C)  TempSrc: Oral Oral Oral Oral  SpO2: 100%   100%  Weight:      Height:        Intake/Output Summary (Last 24 hours) at 07/23/2023 0847 Last data filed at 07/22/2023 2200 Gross per 24 hour  Intake 836 ml  Output 550 ml  Net 286 ml      07/17/2023    8:33 AM 07/10/2023    3:54 PM 06/28/2023    8:12 AM  Last 3 Weights  Weight (lbs) 226 lb 226 lb 9.6 oz 223 lb 9.6 oz  Weight (kg) 102.513 kg 102.785 kg 101.424 kg      Telemetry/ECG    Sinus Rhythm - Personally Reviewed  Physical Exam .   GEN: No acute distress.   Neck: No JVD Cardiac: RRR, no murmurs, rubs, or gallops.  Respiratory: Clear to auscultation bilaterally. GI: Soft, nontender, non-distended  MS: No edema, bilateral UE amp  Assessment & Plan .     77 yo male with PMH of HTN, HLD, GERD, PE post back surgery (completed 6 month course), elevated coronary calcium score who presented for outpatient cardiac cath.   CAD, multivessel -- Patient presented for outpatient cardiac cath and was found to have severe dLM-oLAD of 70% involving D1/RI and small caliber Lcx. Admitted with consult to TCTS for CABG evaluation.  -- Echo showed LVEF of 60-65%, normal RV, no significant valvular disease -- Continue ASA, Coreg 25mg  BID, Rosuvastatin 40mg . Was initially on IV NTG, but in the setting of severely elevated BPs.  -- planned for CABG tomorrow    HTN -- BP poorly controlled prior to admission. Was on IV NTG, now off. He was switched from bystolic to coreg 25mg  BID this admission. BP now much improved. -- Coreg 25mg   BID, Norvasc to 10mg  daily, hydralazine 25mg  TID. ARB held with plans for CABG tomorrow. Can consider transition from hydralazine to hydrochlorothiazide post op for better compliance.    HLD -- LP(a) 8 -- continue Crestor 40mg  daily -- will need FLP/LFTs in 8 weeks   For questions or updates, please contact [facility] Please consult [email or web address] for contact info under        Signed, Laverda Page, NP   I have personally seen and examined this patient. I agree with the assessment and plan as outlined above.  No complaints today. Planning for CABG tomorrow. Continue ASA, statin, beta blocker.   Verne Carrow, MD, [facility] 07/23/2023 9:47 AM

## 2023-07-23 NOTE — Progress Notes (Signed)
Mobility Specialist Progress Note:    07/23/23 1031  Mobility  Activity Ambulated independently in hallway  Level of Assistance Standby assist, set-up cues, supervision of patient - no hands on  Assistive Device None  Distance Ambulated (ft) 120 ft  Activity Response Tolerated well  Mobility Referral Yes  Mobility visit 1 Mobility  Mobility Specialist Start Time (ACUTE ONLY) 0951  Mobility Specialist Stop Time (ACUTE ONLY) 0955  Mobility Specialist Time Calculation (min) (ACUTE ONLY) 4 min   Received pt in bed having no complaints and agreeable to mobility. Pt was asymptomatic throughout ambulation and returned to room w/o fault. Left in bed w/ call bell in reach and all needs met.   D'Vante Earlene Plater Mobility Specialist Please contact via Special educational needs teacher or Rehab office at [phone]

## 2023-07-24 ENCOUNTER — Inpatient Hospital Stay (HOSPITAL_COMMUNITY): Payer: Medicare Other

## 2023-07-24 ENCOUNTER — Encounter (HOSPITAL_COMMUNITY): Payer: Medicare Other

## 2023-07-24 ENCOUNTER — Encounter (HOSPITAL_COMMUNITY): Payer: Self-pay | Admitting: Cardiology

## 2023-07-24 ENCOUNTER — Encounter (HOSPITAL_COMMUNITY)
Admission: AD | Disposition: A | Discharge status: Home Health | Payer: Self-pay | Source: Home / Self Care | Attending: Cardiology

## 2023-07-24 DIAGNOSIS — I25118 Atherosclerotic heart disease of native coronary artery with other forms of angina pectoris: Secondary | ICD-10-CM | POA: Diagnosis not present

## 2023-07-24 DIAGNOSIS — I10 Essential (primary) hypertension: Secondary | ICD-10-CM

## 2023-07-24 DIAGNOSIS — I2510 Atherosclerotic heart disease of native coronary artery without angina pectoris: Secondary | ICD-10-CM

## 2023-07-24 DIAGNOSIS — Z951 Presence of aortocoronary bypass graft: Secondary | ICD-10-CM

## 2023-07-24 HISTORY — DX: Presence of aortocoronary bypass graft: Z95.1

## 2023-07-24 HISTORY — PX: TEE WITHOUT CARDIOVERSION: SHX5443

## 2023-07-24 HISTORY — PX: CORONARY ARTERY BYPASS GRAFT: SHX141

## 2023-07-24 LAB — POCT I-STAT 7, (LYTES, BLD GAS, ICA,H+H)
Acid-base deficit: 2 mmol/L (ref 0.0–2.0)
Acid-base deficit: 4 mmol/L — ABNORMAL HIGH (ref 0.0–2.0)
Acid-base deficit: 5 mmol/L — ABNORMAL HIGH (ref 0.0–2.0)
Acid-base deficit: 6 mmol/L — ABNORMAL HIGH (ref 0.0–2.0)
Acid-base deficit: 4 mmol/L — ABNORMAL HIGH (ref 0.0–2.0)
Bicarbonate: 21.9 mmol/L (ref 20.0–28.0)
Bicarbonate: 21 mmol/L (ref 20.0–28.0)
Bicarbonate: 20.5 mmol/L (ref 20.0–28.0)
Bicarbonate: 20.6 mmol/L (ref 20.0–28.0)
Bicarbonate: 22.3 mmol/L (ref 20.0–28.0)
Calcium, Ion: 1.07 mmol/L — ABNORMAL LOW (ref 1.15–1.40)
Calcium, Ion: 1.24 mmol/L (ref 1.15–1.40)
Calcium, Ion: 1.21 mmol/L (ref 1.15–1.40)
Calcium, Ion: 1.25 mmol/L (ref 1.15–1.40)
Calcium, Ion: 1.24 mmol/L (ref 1.15–1.40)
HCT: 32 % — ABNORMAL LOW (ref 39.0–52.0)
HCT: 30 % — ABNORMAL LOW (ref 39.0–52.0)
HCT: 40 % (ref 39.0–52.0)
HCT: 41 % (ref 39.0–52.0)
HCT: 42 % (ref 39.0–52.0)
Hemoglobin: 10.9 g/dL — ABNORMAL LOW (ref 13.0–17.0)
Hemoglobin: 10.2 g/dL — ABNORMAL LOW (ref 13.0–17.0)
Hemoglobin: 13.6 g/dL (ref 13.0–17.0)
Hemoglobin: 13.9 g/dL (ref 13.0–17.0)
Hemoglobin: 14.3 g/dL (ref 13.0–17.0)
O2 Saturation: 100 %
O2 Saturation: 100 %
O2 Saturation: 98 %
O2 Saturation: 96 %
O2 Saturation: 94 %
Patient temperature: 35.4
Patient temperature: 36.8
Patient temperature: 37.2
Potassium: 4.2 mmol/L (ref 3.5–5.1)
Potassium: 5.2 mmol/L — ABNORMAL HIGH (ref 3.5–5.1)
Potassium: 4.6 mmol/L (ref 3.5–5.1)
Potassium: 4.4 mmol/L (ref 3.5–5.1)
Potassium: 4.5 mmol/L (ref 3.5–5.1)
Sodium: 135 mmol/L (ref 135–145)
Sodium: 134 mmol/L — ABNORMAL LOW (ref 135–145)
Sodium: 134 mmol/L — ABNORMAL LOW (ref 135–145)
Sodium: 136 mmol/L (ref 135–145)
Sodium: 137 mmol/L (ref 135–145)
TCO2: 23 mmol/L (ref 22–32)
TCO2: 22 mmol/L (ref 22–32)
TCO2: 22 mmol/L (ref 22–32)
TCO2: 22 mmol/L (ref 22–32)
TCO2: 24 mmol/L (ref 22–32)
pCO2 arterial: 33.1 mm[Hg] (ref 32–48)
pCO2 arterial: 36 mm[Hg] (ref 32–48)
pCO2 arterial: 35 mm[Hg] (ref 32–48)
pCO2 arterial: 44.4 mm[Hg] (ref 32–48)
pCO2 arterial: 45.6 mm[Hg] (ref 32–48)
pH, Arterial: 7.429 (ref 7.35–7.45)
pH, Arterial: 7.374 (ref 7.35–7.45)
pH, Arterial: 7.369 (ref 7.35–7.45)
pH, Arterial: 7.274 — ABNORMAL LOW (ref 7.35–7.45)
pH, Arterial: 7.299 — ABNORMAL LOW (ref 7.35–7.45)
pO2, Arterial: 354 mm[Hg] — ABNORMAL HIGH (ref 83–108)
pO2, Arterial: 349 mm[Hg] — ABNORMAL HIGH (ref 83–108)
pO2, Arterial: 106 mm[Hg] (ref 83–108)
pO2, Arterial: 90 mm[Hg] (ref 83–108)
pO2, Arterial: 81 mm[Hg] — ABNORMAL LOW (ref 83–108)

## 2023-07-24 LAB — POCT I-STAT, CHEM 8
BUN: 24 mg/dL — ABNORMAL HIGH (ref 8–23)
BUN: 22 mg/dL (ref 8–23)
BUN: 24 mg/dL — ABNORMAL HIGH (ref 8–23)
BUN: 22 mg/dL (ref 8–23)
Calcium, Ion: 1.27 mmol/L (ref 1.15–1.40)
Calcium, Ion: 1.16 mmol/L (ref 1.15–1.40)
Calcium, Ion: 1.09 mmol/L — ABNORMAL LOW (ref 1.15–1.40)
Calcium, Ion: 1.21 mmol/L (ref 1.15–1.40)
Chloride: 101 mmol/L (ref 98–111)
Chloride: 105 mmol/L (ref 98–111)
Chloride: 102 mmol/L (ref 98–111)
Chloride: 104 mmol/L (ref 98–111)
Creatinine, Ser: 0.9 mg/dL (ref 0.61–1.24)
Creatinine, Ser: 0.9 mg/dL (ref 0.61–1.24)
Creatinine, Ser: 1 mg/dL (ref 0.61–1.24)
Creatinine, Ser: 0.8 mg/dL (ref 0.61–1.24)
Glucose, Bld: 100 mg/dL — ABNORMAL HIGH (ref 70–99)
Glucose, Bld: 103 mg/dL — ABNORMAL HIGH (ref 70–99)
Glucose, Bld: 104 mg/dL — ABNORMAL HIGH (ref 70–99)
Glucose, Bld: 117 mg/dL — ABNORMAL HIGH (ref 70–99)
HCT: 41 % (ref 39.0–52.0)
HCT: 39 % (ref 39.0–52.0)
HCT: 29 % — ABNORMAL LOW (ref 39.0–52.0)
HCT: 31 % — ABNORMAL LOW (ref 39.0–52.0)
Hemoglobin: 13.9 g/dL (ref 13.0–17.0)
Hemoglobin: 13.3 g/dL (ref 13.0–17.0)
Hemoglobin: 9.9 g/dL — ABNORMAL LOW (ref 13.0–17.0)
Hemoglobin: 10.5 g/dL — ABNORMAL LOW (ref 13.0–17.0)
Potassium: 4 mmol/L (ref 3.5–5.1)
Potassium: 4.1 mmol/L (ref 3.5–5.1)
Potassium: 5.6 mmol/L — ABNORMAL HIGH (ref 3.5–5.1)
Potassium: 5.2 mmol/L — ABNORMAL HIGH (ref 3.5–5.1)
Sodium: 137 mmol/L (ref 135–145)
Sodium: 137 mmol/L (ref 135–145)
Sodium: 132 mmol/L — ABNORMAL LOW (ref 135–145)
Sodium: 133 mmol/L — ABNORMAL LOW (ref 135–145)
TCO2: 23 mmol/L (ref 22–32)
TCO2: 21 mmol/L — ABNORMAL LOW (ref 22–32)
TCO2: 24 mmol/L (ref 22–32)
TCO2: 22 mmol/L (ref 22–32)

## 2023-07-24 LAB — MAGNESIUM: Magnesium: 3 mg/dL — ABNORMAL HIGH (ref 1.7–2.4)

## 2023-07-24 LAB — POCT I-STAT EG7
Acid-base deficit: 1 mmol/L (ref 0.0–2.0)
Bicarbonate: 23.5 mmol/L (ref 20.0–28.0)
Calcium, Ion: 1.1 mmol/L — ABNORMAL LOW (ref 1.15–1.40)
HCT: 29 % — ABNORMAL LOW (ref 39.0–52.0)
Hemoglobin: 9.9 g/dL — ABNORMAL LOW (ref 13.0–17.0)
O2 Saturation: 84 %
Potassium: 5.6 mmol/L — ABNORMAL HIGH (ref 3.5–5.1)
Sodium: 131 mmol/L — ABNORMAL LOW (ref 135–145)
TCO2: 25 mmol/L (ref 22–32)
pCO2, Ven: 39.4 mm[Hg] — ABNORMAL LOW (ref 44–60)
pH, Ven: 7.384 (ref 7.25–7.43)
pO2, Ven: 50 mm[Hg] — ABNORMAL HIGH (ref 32–45)

## 2023-07-24 LAB — CBC
HCT: 40.2 % (ref 39.0–52.0)
HCT: 41.6 % (ref 39.0–52.0)
Hemoglobin: 13.5 g/dL (ref 13.0–17.0)
Hemoglobin: 14.3 g/dL (ref 13.0–17.0)
MCH: 28.8 pg (ref 26.0–34.0)
MCH: 29.6 pg (ref 26.0–34.0)
MCHC: 33.6 g/dL (ref 30.0–36.0)
MCHC: 34.4 g/dL (ref 30.0–36.0)
MCV: 85.9 fL (ref 80.0–100.0)
MCV: 86.1 fL (ref 80.0–100.0)
Platelets: 266 10*3/uL (ref 150–400)
Platelets: 291 10*3/uL (ref 150–400)
RBC: 4.68 MIL/uL (ref 4.22–5.81)
RBC: 4.83 MIL/uL (ref 4.22–5.81)
RDW: 14 % (ref 11.5–15.5)
RDW: 14.3 % (ref 11.5–15.5)
WBC: 15.4 10*3/uL — ABNORMAL HIGH (ref 4.0–10.5)
WBC: 16.5 10*3/uL — ABNORMAL HIGH (ref 4.0–10.5)
nRBC: 0 % (ref 0.0–0.2)
nRBC: 0 % (ref 0.0–0.2)

## 2023-07-24 LAB — APTT: aPTT: 35 s (ref 24–36)

## 2023-07-24 LAB — PROTIME-INR
INR: 1.3 — ABNORMAL HIGH (ref 0.8–1.2)
Prothrombin Time: 16.3 s — ABNORMAL HIGH (ref 11.4–15.2)

## 2023-07-24 LAB — GLUCOSE, CAPILLARY
Glucose-Capillary: 132 mg/dL — ABNORMAL HIGH (ref 70–99)
Glucose-Capillary: 148 mg/dL — ABNORMAL HIGH (ref 70–99)
Glucose-Capillary: 78 mg/dL (ref 70–99)
Glucose-Capillary: 129 mg/dL — ABNORMAL HIGH (ref 70–99)
Glucose-Capillary: 90 mg/dL (ref 70–99)
Glucose-Capillary: 109 mg/dL — ABNORMAL HIGH (ref 70–99)
Glucose-Capillary: 120 mg/dL — ABNORMAL HIGH (ref 70–99)
Glucose-Capillary: 112 mg/dL — ABNORMAL HIGH (ref 70–99)
Glucose-Capillary: 115 mg/dL — ABNORMAL HIGH (ref 70–99)

## 2023-07-24 LAB — BASIC METABOLIC PANEL
Anion gap: 10 (ref 5–15)
BUN: 20 mg/dL (ref 8–23)
CO2: 21 mmol/L — ABNORMAL LOW (ref 22–32)
Calcium: 8.8 mg/dL — ABNORMAL LOW (ref 8.9–10.3)
Chloride: 105 mmol/L (ref 98–111)
Creatinine, Ser: 1.01 mg/dL (ref 0.61–1.24)
GFR, Estimated: >60 mL/min (ref >60)
Glucose, Bld: 128 mg/dL — ABNORMAL HIGH (ref 70–99)
Potassium: 4.5 mmol/L (ref 3.5–5.1)
Sodium: 136 mmol/L (ref 135–145)

## 2023-07-24 LAB — PLATELET COUNT: Platelets: 227 10*3/uL (ref 150–400)

## 2023-07-24 LAB — HEMOGLOBIN AND HEMATOCRIT, BLOOD
HCT: 31.3 % — ABNORMAL LOW (ref 39.0–52.0)
Hemoglobin: 10.5 g/dL — ABNORMAL LOW (ref 13.0–17.0)

## 2023-07-24 SURGERY — CORONARY ARTERY BYPASS GRAFTING (CABG)
Anesthesia: General | Site: Chest

## 2023-07-24 MED ORDER — EPHEDRINE SULFATE-NACL 50-0.9 MG/10ML-% IV SOSY
PREFILLED_SYRINGE | INTRAVENOUS | Status: DC | PRN
  Administered 2023-07-24: 5 mg via INTRAVENOUS
  Administered 2023-07-24: 2.5 mg via INTRAVENOUS
  Administered 2023-07-24: 5 mg via INTRAVENOUS

## 2023-07-24 MED ORDER — SODIUM CHLORIDE 0.9% FLUSH
3.0000 mL | INTRAVENOUS | Status: DC | PRN
Start: 2023-07-25 — End: 2023-07-25

## 2023-07-24 MED ORDER — BISACODYL 5 MG PO TBEC
10.0000 mg | DELAYED_RELEASE_TABLET | Freq: Every day | ORAL | Status: DC
  Administered 2023-07-25 – 2023-07-27 (×2): 10 mg via ORAL
  Filled 2023-07-24 (×3): qty 2

## 2023-07-24 MED ORDER — SODIUM CHLORIDE 0.9% FLUSH
10.0000 mL | Freq: Two times a day (BID) | INTRAVENOUS | Status: DC
  Administered 2023-07-24 (×2): 10 mL via INTRAVENOUS

## 2023-07-24 MED ORDER — FENTANYL CITRATE (PF) 250 MCG/5ML IJ SOLN
INTRAMUSCULAR | Status: AC
  Filled 2023-07-24: qty 5

## 2023-07-24 MED ORDER — ORAL CARE MOUTH RINSE: 15.0000 mL | OROMUCOSAL | Status: DC | PRN

## 2023-07-24 MED ORDER — MIDAZOLAM HCL (PF) 5 MG/ML IJ SOLN
INTRAMUSCULAR | Status: DC | PRN
  Administered 2023-07-24: 1 mg via INTRAVENOUS
  Administered 2023-07-24: 2 mg via INTRAVENOUS
  Administered 2023-07-24: 1 mg via INTRAVENOUS
  Administered 2023-07-24 (×3): 2 mg via INTRAVENOUS

## 2023-07-24 MED ORDER — CHLORHEXIDINE GLUCONATE CLOTH 2 % EX PADS
6.0000 | MEDICATED_PAD | Freq: Every day | CUTANEOUS | Status: DC
  Administered 2023-07-24 – 2023-07-27 (×4): 6 via TOPICAL

## 2023-07-24 MED ORDER — SODIUM BICARBONATE 8.4 % IV SOLN
50.0000 meq | Freq: Once | INTRAVENOUS | Status: AC
  Administered 2023-07-24: 50 meq via INTRAVENOUS

## 2023-07-24 MED ORDER — HEPARIN SODIUM (PORCINE) 1000 UNIT/ML IJ SOLN
INTRAMUSCULAR | Status: DC | PRN
  Administered 2023-07-24: 33000 [IU] via INTRAVENOUS

## 2023-07-24 MED ORDER — LACTATED RINGERS IV SOLN: INTRAVENOUS | Status: DC

## 2023-07-24 MED ORDER — ASPIRIN 81 MG PO CHEW
324.0000 mg | CHEWABLE_TABLET | Freq: Once | ORAL | Status: AC
  Administered 2023-07-24: 324 mg
  Filled 2023-07-24: qty 4

## 2023-07-24 MED ORDER — MORPHINE SULFATE (PF) 2 MG/ML IV SOLN
1.0000 mg | INTRAVENOUS | Status: DC | PRN
  Administered 2023-07-24 – 2023-07-25 (×3): 2 mg via INTRAVENOUS
  Filled 2023-07-24 (×5): qty 1

## 2023-07-24 MED ORDER — SODIUM CHLORIDE (PF) 0.9 % IJ SOLN
OROMUCOSAL | Status: DC | PRN
  Administered 2023-07-24 (×2): 4 mL via TOPICAL

## 2023-07-24 MED ORDER — POTASSIUM CHLORIDE 10 MEQ/50ML IV SOLN: 10.0000 meq | INTRAVENOUS | Status: AC

## 2023-07-24 MED ORDER — SODIUM CHLORIDE 0.45 % IV SOLN: INTRAVENOUS | Status: DC | PRN

## 2023-07-24 MED ORDER — ACETAMINOPHEN 160 MG/5ML PO SOLN
650.0000 mg | Freq: Once | ORAL | Status: AC
  Administered 2023-07-24: 650 mg
  Filled 2023-07-24: qty 20.3

## 2023-07-24 MED ORDER — CHLORHEXIDINE GLUCONATE 0.12 % MT SOLN
15.0000 mL | OROMUCOSAL | Status: AC
  Administered 2023-07-24: 15 mL via OROMUCOSAL
  Filled 2023-07-24: qty 15

## 2023-07-24 MED ORDER — METOCLOPRAMIDE HCL 5 MG/ML IJ SOLN
10.0000 mg | Freq: Four times a day (QID) | INTRAMUSCULAR | Status: AC
Start: 2023-07-24 — End: 2023-07-25
  Administered 2023-07-24 – 2023-07-25 (×4): 10 mg via INTRAVENOUS
  Filled 2023-07-24 (×3): qty 2

## 2023-07-24 MED ORDER — DOBUTAMINE-DEXTROSE 4-5 MG/ML-% IV SOLN: 0.0000 ug/kg/min | INTRAVENOUS | Status: DC

## 2023-07-24 MED ORDER — ACETAMINOPHEN 160 MG/5ML PO SOLN: 1000.0000 mg | Freq: Four times a day (QID) | ORAL | Status: DC

## 2023-07-24 MED ORDER — PROTAMINE SULFATE 10 MG/ML IV SOLN
INTRAVENOUS | Status: AC
  Filled 2023-07-24: qty 5

## 2023-07-24 MED ORDER — ALBUMIN HUMAN 5 % IV SOLN
250.0000 mL | INTRAVENOUS | Status: DC | PRN
  Administered 2023-07-24: 12.5 g via INTRAVENOUS

## 2023-07-24 MED ORDER — DEXTROSE 50 % IV SOLN: 0.0000 mL | INTRAVENOUS | Status: DC | PRN

## 2023-07-24 MED ORDER — OXYCODONE HCL 5 MG PO TABS
5.0000 mg | ORAL_TABLET | ORAL | Status: DC | PRN
  Administered 2023-07-24 – 2023-07-25 (×2): 5 mg via ORAL
  Filled 2023-07-24 (×2): qty 1

## 2023-07-24 MED ORDER — PANTOPRAZOLE SODIUM 40 MG PO TBEC
40.0000 mg | DELAYED_RELEASE_TABLET | Freq: Every day | ORAL | Status: DC
  Administered 2023-07-26 – 2023-07-28 (×3): 40 mg via ORAL
  Filled 2023-07-24 (×3): qty 1

## 2023-07-24 MED ORDER — LIDOCAINE 2% (20 MG/ML) 5 ML SYRINGE
INTRAMUSCULAR | Status: AC
  Filled 2023-07-24: qty 5

## 2023-07-24 MED ORDER — ORAL CARE MOUTH RINSE: 15.0000 mL | OROMUCOSAL | Status: DC

## 2023-07-24 MED ORDER — ASPIRIN 81 MG PO CHEW: 324.0000 mg | CHEWABLE_TABLET | Freq: Once | ORAL | Status: DC

## 2023-07-24 MED ORDER — PROPOFOL 10 MG/ML IV BOLUS
INTRAVENOUS | Status: AC
  Filled 2023-07-24: qty 20

## 2023-07-24 MED ORDER — PHENYLEPHRINE HCL-NACL 20-0.9 MG/250ML-% IV SOLN
0.0000 ug/min | INTRAVENOUS | Status: DC
Start: 2023-07-24 — End: 2023-07-25

## 2023-07-24 MED ORDER — ONDANSETRON HCL 4 MG/2ML IJ SOLN
4.0000 mg | Freq: Four times a day (QID) | INTRAMUSCULAR | Status: DC | PRN
  Administered 2023-07-25 (×2): 4 mg via INTRAVENOUS
  Filled 2023-07-24 (×2): qty 2

## 2023-07-24 MED ORDER — ROCURONIUM BROMIDE 10 MG/ML (PF) SYRINGE
PREFILLED_SYRINGE | INTRAVENOUS | Status: AC
  Filled 2023-07-24: qty 10

## 2023-07-24 MED ORDER — ALBUMIN HUMAN 5 % IV SOLN: INTRAVENOUS | Status: DC | PRN

## 2023-07-24 MED ORDER — MIDAZOLAM HCL 2 MG/2ML IJ SOLN
2.0000 mg | INTRAMUSCULAR | Status: DC | PRN
Start: 2023-07-24 — End: 2023-07-27

## 2023-07-24 MED ORDER — FENTANYL CITRATE (PF) 250 MCG/5ML IJ SOLN
INTRAMUSCULAR | Status: DC | PRN
  Administered 2023-07-24: 50 ug via INTRAVENOUS
  Administered 2023-07-24: 150 ug via INTRAVENOUS
  Administered 2023-07-24: 100 ug via INTRAVENOUS
  Administered 2023-07-24 (×2): 50 ug via INTRAVENOUS
  Administered 2023-07-24: 150 ug via INTRAVENOUS
  Administered 2023-07-24: 100 ug via INTRAVENOUS
  Administered 2023-07-24: 50 ug via INTRAVENOUS
  Administered 2023-07-24: 150 ug via INTRAVENOUS
  Administered 2023-07-24: 50 ug via INTRAVENOUS
  Administered 2023-07-24: 100 ug via INTRAVENOUS

## 2023-07-24 MED ORDER — ACETAMINOPHEN 500 MG PO TABS
1000.0000 mg | ORAL_TABLET | Freq: Four times a day (QID) | ORAL | Status: DC
  Administered 2023-07-24 – 2023-07-27 (×6): 1000 mg via ORAL
  Filled 2023-07-24 (×7): qty 2

## 2023-07-24 MED ORDER — ASPIRIN 325 MG PO TBEC
325.0000 mg | DELAYED_RELEASE_TABLET | Freq: Every day | ORAL | Status: DC
  Administered 2023-07-25 – 2023-07-28 (×4): 325 mg via ORAL
  Filled 2023-07-24 (×4): qty 1

## 2023-07-24 MED ORDER — INSULIN REGULAR(HUMAN) IN NACL 100-0.9 UT/100ML-% IV SOLN
INTRAVENOUS | Status: DC
  Administered 2023-07-24: 0.5 [IU]/h via INTRAVENOUS

## 2023-07-24 MED ORDER — LACTATED RINGERS IV SOLN: INTRAVENOUS | Status: DC | PRN

## 2023-07-24 MED ORDER — PHENYLEPHRINE 80 MCG/ML (10ML) SYRINGE FOR IV PUSH (FOR BLOOD PRESSURE SUPPORT)
PREFILLED_SYRINGE | INTRAVENOUS | Status: DC | PRN
  Administered 2023-07-24 (×2): 160 ug via INTRAVENOUS

## 2023-07-24 MED ORDER — EPHEDRINE 5 MG/ML INJ
INTRAVENOUS | Status: AC
  Filled 2023-07-24: qty 5

## 2023-07-24 MED ORDER — VANCOMYCIN HCL IN DEXTROSE 1-5 GM/200ML-% IV SOLN
1000.0000 mg | Freq: Once | INTRAVENOUS | Status: AC
  Administered 2023-07-24: 1000 mg via INTRAVENOUS
  Filled 2023-07-24: qty 200

## 2023-07-24 MED ORDER — SODIUM CHLORIDE 0.9% FLUSH: 10.0000 mL | INTRAVENOUS | Status: DC | PRN

## 2023-07-24 MED ORDER — DOCUSATE SODIUM 100 MG PO CAPS
200.0000 mg | ORAL_CAPSULE | Freq: Every day | ORAL | Status: DC
  Administered 2023-07-25 – 2023-07-27 (×2): 200 mg via ORAL
  Filled 2023-07-24 (×3): qty 2

## 2023-07-24 MED ORDER — ASPIRIN 81 MG PO CHEW: 324.0000 mg | CHEWABLE_TABLET | Freq: Every day | ORAL | Status: DC

## 2023-07-24 MED ORDER — BISACODYL 10 MG RE SUPP: 10.0000 mg | Freq: Every day | RECTAL | Status: DC

## 2023-07-24 MED ORDER — 0.9 % SODIUM CHLORIDE (POUR BTL) OPTIME
TOPICAL | Status: DC | PRN
  Administered 2023-07-24: 5000 mL

## 2023-07-24 MED ORDER — NOREPINEPHRINE 4 MG/250ML-% IV SOLN
0.0000 ug/min | INTRAVENOUS | Status: DC
Start: 2023-07-24 — End: 2023-07-25

## 2023-07-24 MED ORDER — PROTAMINE SULFATE 10 MG/ML IV SOLN
INTRAVENOUS | Status: AC
  Filled 2023-07-24: qty 25

## 2023-07-24 MED ORDER — METOPROLOL TARTRATE 12.5 MG HALF TABLET: 12.5000 mg | ORAL_TABLET | Freq: Two times a day (BID) | ORAL | Status: DC

## 2023-07-24 MED ORDER — NICARDIPINE HCL IN NACL 20-0.86 MG/200ML-% IV SOLN
0.0000 mg/h | INTRAVENOUS | Status: DC
Start: 2023-07-24 — End: 2023-07-25

## 2023-07-24 MED ORDER — TRAMADOL HCL 50 MG PO TABS
50.0000 mg | ORAL_TABLET | ORAL | Status: DC | PRN
  Administered 2023-07-24: 50 mg via ORAL
  Administered 2023-07-25: 100 mg via ORAL
  Filled 2023-07-24: qty 2
  Filled 2023-07-24: qty 1

## 2023-07-24 MED ORDER — METOPROLOL TARTRATE 12.5 MG HALF TABLET
12.5000 mg | ORAL_TABLET | Freq: Two times a day (BID) | ORAL | Status: DC
  Administered 2023-07-24: 12.5 mg via ORAL
  Filled 2023-07-24: qty 1

## 2023-07-24 MED ORDER — SODIUM CHLORIDE 0.9% FLUSH: 3.0000 mL | Freq: Two times a day (BID) | INTRAVENOUS | Status: DC

## 2023-07-24 MED ORDER — PLASMA-LYTE A IV SOLN
INTRAVENOUS | Status: DC | PRN
  Administered 2023-07-24: 500 mL via INTRAVASCULAR

## 2023-07-24 MED ORDER — SODIUM CHLORIDE 0.9 % IV SOLN: INTRAVENOUS | Status: DC | PRN

## 2023-07-24 MED ORDER — METOPROLOL TARTRATE 25 MG/10 ML ORAL SUSPENSION: 12.5000 mg | Freq: Two times a day (BID) | ORAL | Status: DC

## 2023-07-24 MED ORDER — PROTAMINE SULFATE 10 MG/ML IV SOLN
INTRAVENOUS | Status: DC | PRN
  Administered 2023-07-24: 300 mg via INTRAVENOUS

## 2023-07-24 MED ORDER — HEPARIN SODIUM (PORCINE) 1000 UNIT/ML IJ SOLN
INTRAMUSCULAR | Status: AC
  Filled 2023-07-24: qty 1

## 2023-07-24 MED ORDER — PANTOPRAZOLE SODIUM 40 MG IV SOLR
40.0000 mg | Freq: Every day | INTRAVENOUS | Status: AC
  Administered 2023-07-24 – 2023-07-25 (×2): 40 mg via INTRAVENOUS
  Filled 2023-07-24 (×2): qty 10

## 2023-07-24 MED ORDER — SODIUM CHLORIDE 0.9 % IV SOLN: INTRAVENOUS | Status: DC

## 2023-07-24 MED ORDER — PHENYLEPHRINE 80 MCG/ML (10ML) SYRINGE FOR IV PUSH (FOR BLOOD PRESSURE SUPPORT)
PREFILLED_SYRINGE | INTRAVENOUS | Status: AC
  Filled 2023-07-24: qty 10

## 2023-07-24 MED ORDER — METOCLOPRAMIDE HCL 5 MG/ML IJ SOLN
10.0000 mg | Freq: Four times a day (QID) | INTRAMUSCULAR | Status: DC
  Filled 2023-07-24: qty 2

## 2023-07-24 MED ORDER — ROCURONIUM BROMIDE 10 MG/ML (PF) SYRINGE
PREFILLED_SYRINGE | INTRAVENOUS | Status: DC | PRN
  Administered 2023-07-24 (×2): 100 mg via INTRAVENOUS
  Administered 2023-07-24: 50 mg via INTRAVENOUS

## 2023-07-24 MED ORDER — PROPOFOL 10 MG/ML IV BOLUS
INTRAVENOUS | Status: DC | PRN
  Administered 2023-07-24 (×3): 50 mg via INTRAVENOUS

## 2023-07-24 MED ORDER — MAGNESIUM SULFATE 4 GM/100ML IV SOLN
4.0000 g | Freq: Once | INTRAVENOUS | Status: AC
  Administered 2023-07-24: 4 g via INTRAVENOUS
  Filled 2023-07-24: qty 100

## 2023-07-24 MED ORDER — MIDAZOLAM HCL (PF) 10 MG/2ML IJ SOLN
INTRAMUSCULAR | Status: AC
Start: 2023-07-24 — End: ?
  Filled 2023-07-24: qty 2

## 2023-07-24 MED ORDER — SODIUM CHLORIDE 0.9% FLUSH
10.0000 mL | Freq: Two times a day (BID) | INTRAVENOUS | Status: DC
  Administered 2023-07-24: 10 mL

## 2023-07-24 MED ORDER — DEXMEDETOMIDINE HCL IN NACL 400 MCG/100ML IV SOLN
0.0000 ug/kg/h | INTRAVENOUS | Status: DC
Start: 2023-07-24 — End: 2023-07-25
  Administered 2023-07-24: 0.3 ug/kg/h via INTRAVENOUS
  Filled 2023-07-24: qty 100

## 2023-07-24 MED ORDER — CEFAZOLIN SODIUM-DEXTROSE 2-4 GM/100ML-% IV SOLN
2.0000 g | Freq: Three times a day (TID) | INTRAVENOUS | Status: AC
  Administered 2023-07-24 – 2023-07-26 (×6): 2 g via INTRAVENOUS
  Filled 2023-07-24 (×6): qty 100

## 2023-07-24 MED ORDER — METOPROLOL TARTRATE 5 MG/5ML IV SOLN: 2.5000 mg | INTRAVENOUS | Status: DC | PRN

## 2023-07-24 MED FILL — Lidocaine HCl Local Preservative Free (PF) Inj 2%: INTRAMUSCULAR | Qty: 14 | Status: AC

## 2023-07-24 MED FILL — Heparin Sodium (Porcine) Inj 1000 Unit/ML: Qty: 1000 | Status: AC

## 2023-07-24 MED FILL — Potassium Chloride Inj 2 mEq/ML: INTRAVENOUS | Qty: 40 | Status: AC

## 2023-07-24 SURGICAL SUPPLY — 81 items
BAG DECANTER FOR FLEXI CONT (MISCELLANEOUS) ×2 IMPLANT
BLADE STERNUM SYSTEM 6 (BLADE) ×2 IMPLANT
BNDG ELASTIC 4INX 5YD STR LF (GAUZE/BANDAGES/DRESSINGS) IMPLANT
BNDG ELASTIC 4X5.8 VLCR STR LF (GAUZE/BANDAGES/DRESSINGS) ×2 IMPLANT
BNDG ELASTIC 6X10 VLCR STRL LF (GAUZE/BANDAGES/DRESSINGS) IMPLANT
BNDG ELASTIC 6X5.8 VLCR STR LF (GAUZE/BANDAGES/DRESSINGS) ×2 IMPLANT
BNDG GAUZE DERMACEA FLUFF 4 (GAUZE/BANDAGES/DRESSINGS) ×2 IMPLANT
CABLE SURGICAL S-101-97-12 (CABLE) ×2 IMPLANT
CANISTER SUCT 3000ML PPV (MISCELLANEOUS) ×2 IMPLANT
CANNULA MC2 2 STG 29/37 NON-V (CANNULA) ×2 IMPLANT
CANNULA NON VENT 22FR 12 (CANNULA) IMPLANT
CATH ROBINSON RED A/P 18FR (CATHETERS) ×4 IMPLANT
CLIP RETRACTION 3.0MM CORONARY (MISCELLANEOUS) IMPLANT
CLIP TI MEDIUM 24 (CLIP) IMPLANT
CLIP TI WIDE RED SMALL 24 (CLIP) IMPLANT
CONN ST 1/2X1/2 BEN (MISCELLANEOUS) ×2 IMPLANT
CONNECTOR BLAKE 2:1 CARIO BLK (MISCELLANEOUS) ×2 IMPLANT
CONTAINER PROTECT SURGISLUSH (MISCELLANEOUS) ×4 IMPLANT
DERMABOND ADVANCED .7 DNX12 (GAUZE/BANDAGES/DRESSINGS) IMPLANT
DRAIN CHANNEL 19F RND (DRAIN) ×6 IMPLANT
DRAIN CONNECTOR BLAKE 1:1 (MISCELLANEOUS) ×2 IMPLANT
DRAPE INCISE IOBAN 66X45 STRL (DRAPES) IMPLANT
DRAPE SRG 135X102X78XABS (DRAPES) ×2 IMPLANT
DRAPE WARM FLUID 44X44 (DRAPES) ×2 IMPLANT
DRSG AQUACEL AG ADV 3.5X10 (GAUZE/BANDAGES/DRESSINGS) ×2 IMPLANT
ELECT BLADE 4.0 EZ CLEAN MEGAD (MISCELLANEOUS) ×2
ELECT REM PT RETURN 9FT ADLT (ELECTROSURGICAL) ×4
ELECTRODE BLDE 4.0 EZ CLN MEGD (MISCELLANEOUS) ×2 IMPLANT
ELECTRODE REM PT RTRN 9FT ADLT (ELECTROSURGICAL) ×4 IMPLANT
FELT TEFLON 1X6 (MISCELLANEOUS) ×4 IMPLANT
GAUZE 4X4 16PLY ~~LOC~~+RFID DBL (SPONGE) ×2 IMPLANT
GAUZE SPONGE 4X4 12PLY STRL (GAUZE/BANDAGES/DRESSINGS) ×4 IMPLANT
GLOVE BIO SURGEON STRL SZ7 (GLOVE) ×4 IMPLANT
GLOVE BIOGEL M STRL SZ7.5 (GLOVE) ×4 IMPLANT
GLOVE BIOGEL PI IND STRL 6.5 (GLOVE) IMPLANT
GLOVE SURG SS PI 8.0 STRL IVOR (GLOVE) IMPLANT
GOWN STRL REUS W/ TWL LRG LVL3 (GOWN DISPOSABLE) ×8 IMPLANT
GOWN STRL REUS W/ TWL XL LVL3 (GOWN DISPOSABLE) ×4 IMPLANT
HEMOSTAT POWDER SURGIFOAM 1G (HEMOSTASIS) ×4 IMPLANT
INSERT SUTURE HOLDER (MISCELLANEOUS) ×2 IMPLANT
KIT BASIN OR (CUSTOM PROCEDURE TRAY) ×2 IMPLANT
KIT SUCTION CATH 14FR (SUCTIONS) ×2 IMPLANT
KIT TURNOVER KIT B (KITS) ×2 IMPLANT
KIT VASOVIEW HEMOPRO 2 VH 4000 (KITS) ×2 IMPLANT
LEAD PACING MYOCARDI (MISCELLANEOUS) ×2 IMPLANT
MARKER GRAFT CORONARY BYPASS (MISCELLANEOUS) ×6 IMPLANT
NS IRRIG 1000ML POUR BTL (IV SOLUTION) ×10 IMPLANT
PACK E OPEN HEART (SUTURE) ×2 IMPLANT
PACK OPEN HEART (CUSTOM PROCEDURE TRAY) ×2 IMPLANT
PAD ARMBOARD 7.5X6 YLW CONV (MISCELLANEOUS) ×4 IMPLANT
PAD ELECT DEFIB RADIOL ZOLL (MISCELLANEOUS) ×2 IMPLANT
PENCIL BUTTON HOLSTER BLD 10FT (ELECTRODE) ×2 IMPLANT
POSITIONER HEAD DONUT 9IN (MISCELLANEOUS) ×2 IMPLANT
PUNCH AORTIC ROTATE 4.0MM (MISCELLANEOUS) ×2 IMPLANT
SET MICROPUNCTURE 5F STIFF (MISCELLANEOUS) IMPLANT
SET MPS 3-ND DEL (MISCELLANEOUS) IMPLANT
SPONGE T-LAP 18X18 ~~LOC~~+RFID (SPONGE) ×8 IMPLANT
STOPCOCK 4 WAY LG BORE MALE ST (IV SETS) IMPLANT
SUPPORT HEART JANKE-BARRON (MISCELLANEOUS) ×2 IMPLANT
SUT BONE WAX W31G (SUTURE) ×2 IMPLANT
SUT ETHIBOND X763 2 0 SH 1 (SUTURE) ×4 IMPLANT
SUT MNCRL AB 3-0 PS2 18 (SUTURE) ×4 IMPLANT
SUT PDS AB 1 CTX 36 (SUTURE) ×4 IMPLANT
SUT PROLENE 4 0 SH DA (SUTURE) ×2 IMPLANT
SUT PROLENE 5 0 C 1 36 (SUTURE) ×6 IMPLANT
SUT PROLENE 7 0 BV 1 (SUTURE) IMPLANT
SUT PROLENE 7 0 BV1 MDA (SUTURE) ×2 IMPLANT
SUT SILK 2 0 SH CR/8 (SUTURE) IMPLANT
SUT STEEL 6MS V (SUTURE) ×4 IMPLANT
SUT VIC AB 2-0 CT1 TAPERPNT 27 (SUTURE) IMPLANT
SYSTEM SAHARA CHEST DRAIN ATS (WOUND CARE) ×2 IMPLANT
TAPE CLOTH 4X10 WHT NS (GAUZE/BANDAGES/DRESSINGS) IMPLANT
TAPE PAPER 2X10 WHT MICROPORE (GAUZE/BANDAGES/DRESSINGS) IMPLANT
TOWEL GREEN STERILE (TOWEL DISPOSABLE) ×2 IMPLANT
TOWEL GREEN STERILE FF (TOWEL DISPOSABLE) ×2 IMPLANT
TRAY CATH LUMEN 1 20CM STRL (SET/KITS/TRAYS/PACK) IMPLANT
TRAY FOLEY SLVR 16FR TEMP STAT (SET/KITS/TRAYS/PACK) ×2 IMPLANT
TUBING ART PRESS 48 MALE/FEM (TUBING) IMPLANT
TUBING LAP HI FLOW INSUFFLATIO (TUBING) ×2 IMPLANT
UNDERPAD 30X36 HEAVY ABSORB (UNDERPADS AND DIAPERS) ×2 IMPLANT
WATER STERILE IRR 1000ML POUR (IV SOLUTION) ×4 IMPLANT

## 2023-07-24 NOTE — Anesthesia Postprocedure Evaluation (Signed)
Anesthesia Post Note  Patient: Rockland Alberding  Procedure(s) Performed: CORONARY ARTERY BYPASS GRAFTING (CABG) x TWO, USING LEFT INTERNAL MAMMARY ARTERY & RIGHT LEG GREATER SAPHENOUS VEIN HARVESTED ENDOSCOPICALLY (Chest) TRANSESOPHAGEAL ECHOCARDIOGRAM (TEE)     Patient location during evaluation: SICU Anesthesia Type: General Level of consciousness: sedated Pain management: pain level controlled Vital Signs Assessment: post-procedure vital signs reviewed and stable Respiratory status: patient remains intubated per anesthesia plan Cardiovascular status: stable Postop Assessment: no apparent nausea or vomiting Anesthetic complications: no   No notable events documented.  Last Vitals:  Vitals:   07/24/23 1445 07/24/23 1500  BP:  109/71  Pulse: 73 74  Resp: 16 16  Temp: (!) 35.5 C (!) 35.5 C  SpO2: 100% 100%    Last Pain:  Vitals:   07/24/23 0750  TempSrc: Oral  PainSc:                  Catheryn Bacon Aquarius Latouche

## 2023-07-24 NOTE — Procedures (Signed)
Extubation Procedure Note  Patient Details:   Name: Chad Gordon DOB: Oct 11, 1945 MRN: [id number]   Airway Documentation:    Vent end date: 07/24/23 Vent end time: 1737   Evaluation  O2 sats: stable throughout Complications: No apparent complications Patient did tolerate procedure well. Bilateral Breath Sounds: Clear   Yes Pt extubated to 4L [place]. NIF: -21 cmH2O VC: 650 mL Okay to extubate per MD Allen Norris 07/24/2023, 5:37 PM

## 2023-07-24 NOTE — Anesthesia Procedure Notes (Signed)
Central Venous Catheter Insertion Performed by: Leonides Grills, MD, anesthesiologist Start/End12/18/2024 7:00 AM, 07/24/2023 7:20 AM Patient location: Pre-op. Preanesthetic checklist: patient identified, IV checked, site marked, risks and benefits discussed, surgical consent, monitors and equipment checked, pre-op evaluation, timeout performed and anesthesia consent Position: Trendelenburg Lidocaine 1% used for infiltration and patient sedated Hand hygiene performed , maximum sterile barriers used  and Seldinger technique used Catheter size: 8.5 Fr Total catheter length 10. Sheath introducer Procedure performed using ultrasound guided technique. Ultrasound Notes:anatomy identified, needle tip was noted to be adjacent to the nerve/plexus identified, no ultrasound evidence of intravascular and/or intraneural injection and image(s) printed for medical record Attempts: 1 Following insertion, line sutured and dressing applied. Post procedure assessment: blood return through all ports, free fluid flow and no air  Patient tolerated the procedure well with no immediate complications. Additional procedure comments: Arrow three lumen CVC placed in the PA port .

## 2023-07-24 NOTE — Op Note (Signed)
[address]       [name] [address]             [phone]                                          07/24/2023 Patient:  Chad Gordon Pre-Op Dx: Left main coronary artery disease HTN   Post-op Dx:  same Procedure: CABG X 2.  LIMA LAD, OM1   Endoscopic greater saphenous vein harvest on the right   Surgeon and Role:      * Nailyn Dearinger, Eliezer Lofts, MD - Primary    Webb Laws , PA-C - assisting An experienced assistant was required given the complexity of this surgery and the standard of surgical care. The assistant was needed for exposure, dissection, suctioning, retraction of delicate tissues and sutures, instrument exchange and for overall help during this procedure.    Anesthesia  general EBL:  Blood Administration: none Xclamp Time:  33 min Pump Time:   Drains: 19 F blake drain: L, mediastinal  Wires: ventricular Counts: correct   Indications: 77yo male with distal LM disease.  Echocardiogram shows preserved biventricular function and no significant valvular disease.  Will plan for CABG on 12/18   Findings: Good vein and LIMA.  Good distal targest  Operative Technique: All invasive lines were placed in pre-op holding.  After the risks, benefits and alternatives were thoroughly discussed, the patient was brought to the operative theatre.  Anesthesia was induced, and the patient was prepped and draped in normal sterile fashion.  An appropriate surgical pause was performed, and pre-operative antibiotics were dosed accordingly.  We began with simultaneous incisions along the right leg for harvesting of the greater saphenous vein and the chest for the sternotomy.  In regards to the sternotomy, this was carried down with bovie cautery, and the sternum was divided with a reciprocating saw.  Meticulous hemostasis was obtained.  The left internal thoracic artery was exposed and harvested in in pedicled fashion.  The patient was systemically  heparinized, and the artery was divided distally, and placed in a papaverine sponge.    The sternal elevator was removed, and a retractor was placed.  The pericardium was divided in the midline and fashioned into a cradle with pericardial stitches.   After we confirmed an appropriate ACT, the ascending aorta was cannulated in standard fashion.  The right atrial appendage was used for venous cannulation site.  Cardiopulmonary bypass was initiated, and the heart retractor was placed. The cross clamp was applied, and a dose of anterograde cardioplegia was given with good arrest of the heart.  Next we exposed the lateral wall, and found a good target on the OM.  An end to side anastomosis with the vein graft was then created.  Finally, we exposed a good target on the LAD, and fashioned an end to side anastomosis between it and the LITA.  We began to re-warm, and a re-animation dose of cardioplegia was given.  The heart was de-aired, and the cross clamp was removed.  Meticulous hemostasis was obtained.    A partial occludding clamp was then placed on the ascending aorta, and we created an end to side anastomosis between it and the proximal vein graft.  Rings were placed on the proximal anastomosis.  Hemostasis was obtained, and we separated from cardiopulmonary bypass without  event.  The heparin was reversed with protamine.  Chest tubes and wires were placed, and the sternum was re-approximated with sternal wires.  The soft tissue and skin were re-approximated wth absorbable suture.    The patient tolerated the procedure without any immediate complications, and was transferred to the ICU in guarded condition.  [name]
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
## 2023-07-24 NOTE — Transfer of Care (Signed)
Immediate Anesthesia Transfer of Care Note  Patient: Avedis Callo  Procedure(s) Performed: CORONARY ARTERY BYPASS GRAFTING (CABG) x TWO, USING LEFT INTERNAL MAMMARY ARTERY & RIGHT LEG GREATER SAPHENOUS VEIN HARVESTED ENDOSCOPICALLY (Chest) TRANSESOPHAGEAL ECHOCARDIOGRAM (TEE)  Patient Location: SICU  Anesthesia Type:General  Level of Consciousness: sedated and Patient remains intubated per anesthesia plan  Airway & Oxygen Therapy: Patient remains intubated per anesthesia plan and Patient placed on Ventilator (see vital sign flow sheet for setting)  Post-op Assessment: Report given to RN and Post -op Vital signs reviewed and stable  Post vital signs: Reviewed and stable  Last Vitals:  Vitals Value Taken Time  BP 109/51 07/24/23 1325  Temp 35.6 C 07/24/23 1330  Pulse 74 07/24/23 1330  Resp 16 07/24/23 1330  SpO2 100 % 07/24/23 1330  Vitals shown include unfiled device data.  Last Pain:  Vitals:   07/24/23 0750  TempSrc: Oral  PainSc:       Patients Stated Pain Goal: 0 (07/21/23 2136)  Complications: No notable events documented.

## 2023-07-24 NOTE — Anesthesia Preprocedure Evaluation (Addendum)
Anesthesia Evaluation  Patient identified by MRN, date of birth, ID band Patient awake    Reviewed: Allergy & Precautions, NPO status , Patient's Chart, lab work & pertinent test results  Airway Mallampati: II  TM Distance: >3 FB Neck ROM: Full    Dental  (+) Edentulous Upper, Poor Dentition, Missing   Pulmonary former smoker, PE   Pulmonary exam normal        Cardiovascular hypertension, Pt. on medications and Pt. on home beta blockers + CAD  Normal cardiovascular exam  ECHO: 1. Left ventricular ejection fraction, by estimation, is 60 to 65%. The  left ventricle has normal function. The left ventricle has no regional  wall motion abnormalities. Left ventricular diastolic parameters were  normal.   2. Right ventricular systolic function is normal. The right ventricular  size is normal.   3. The mitral valve is grossly normal. Trivial mitral valve  regurgitation. No evidence of mitral stenosis.   4. The aortic valve was not well visualized. Aortic valve regurgitation  is not visualized. Aortic valve sclerosis is present, with no evidence of  aortic valve stenosis.   5. The inferior vena cava is normal in size with greater than 50%  respiratory variability, suggesting right atrial pressure of 3 mmHg.     Neuro/Psych  Headaches  Neuromuscular disease  negative psych ROS   GI/Hepatic Neg liver ROS,GERD  Medicated and Controlled,,  Endo/Other  negative endocrine ROS    Renal/GU negative Renal ROS     Musculoskeletal  (+) Arthritis ,    Abdominal   Peds  Hematology negative hematology ROS (+)   Anesthesia Other Findings CAD  LMD    Reproductive/Obstetrics                             Anesthesia Physical Anesthesia Plan  ASA: 4  Anesthesia Plan: General   Post-op Pain Management:    Induction: Intravenous  PONV Risk Score and Plan: 2 and Ondansetron, Dexamethasone, Midazolam and  Treatment may vary due to age or medical condition  Airway Management Planned: Oral ETT  Additional Equipment: Arterial line, CVP, TEE and Ultrasound Guidance Line Placement  Intra-op Plan:   Post-operative Plan: Post-operative intubation/ventilation  Informed Consent: I have reviewed the patients History and Physical, chart, labs and discussed the procedure including the risks, benefits and alternatives for the proposed anesthesia with the patient or authorized representative who has indicated his/her understanding and acceptance.     Dental advisory given  Plan Discussed with: CRNA  Anesthesia Plan Comments:         Anesthesia Quick Evaluation

## 2023-07-24 NOTE — Progress Notes (Signed)
Pre-extubation ABG below. Respiratory parameters outlined in RT extubation note. Results communicated to Dr Merrily Pew. Orders received to give 1 amp IV bicarb and proceed with extubation. Pt extubated to 4L [place] initially, bumped up to 6L for O2 sats <92%. Patient mouth breathing so nasal cannula switched out for Venti mask. Sats now stable 92-97% on 8L Venti mask.   Latest Reference Range & Units 07/24/23 17:22  Sample type  ARTERIAL  pH, Arterial 7.35 - 7.45  7.274 (L)  pCO2 arterial 32 - 48 mmHg 44.4  pO2, Arterial 83 - 108 mmHg 90  TCO2 22 - 32 mmol/L 22  Acid-base deficit 0.0 - 2.0 mmol/L 6.0 (H)  Bicarbonate 20.0 - 28.0 mmol/L 20.6  O2 Saturation % 96  Patient temperature  36.8 C  (L): Data is abnormally low (H): Data is abnormally high

## 2023-07-24 NOTE — Brief Op Note (Signed)
07/17/2023 - 07/24/2023  7:12 AM  PATIENT:  Chad Gordon  77 y.o. male  PRE-OPERATIVE DIAGNOSIS:  coronary artery disease  POST-OPERATIVE DIAGNOSIS:  coronary artery disease  PROCEDURE:  Procedure(s): CORONARY ARTERY BYPASS GRAFTING (CABG) x TWO, USING LEFT INTERNAL MAMMARY ARTERY & RIGHT LEG GREATER SAPHENOUS VEIN HARVESTED ENDOSCOPICALLY (N/A) TRANSESOPHAGEAL ECHOCARDIOGRAM (TEE) (N/A) Vein harvest time: Vein prep time: LIMA-LAD, SVG-OM1  SURGEON:  Surgeons and Role:    * Lightfoot, Eliezer Lofts, MD - Primary  PHYSICIAN ASSISTANT: Deagen Krass PA-C  ASSISTANTS: Virgilio Frees RNFA   ANESTHESIA:   general  EBL:  644 mL   BLOOD ADMINISTERED:none  DRAINS:  LEFT PLEURAL AND MEDIASTINAL CHEST TUBES    LOCAL MEDICATIONS USED:  NONE  SPECIMEN:  No Specimen  DISPOSITION OF SPECIMEN:  N/A  COUNTS:  YES  TOURNIQUET:  * No tourniquets in log *  DICTATION: .Dragon Dictation  PLAN OF CARE: Admit to inpatient   PATIENT DISPOSITION:  ICU - intubated and hemodynamically stable.   Delay start of Pharmacological VTE agent (>24hrs) due to surgical blood loss or risk of bleeding: yes  COMPLICATIONS: NO KNOWN

## 2023-07-24 NOTE — Hospital Course (Addendum)
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  Back surgery in March of this year and has completed a 48-month treatment with oral anticoagulation.  Chad Gordon is retired and is married but lives alone as his wife is in a long-term care facility.  He was seen by his primary care physician last month and at that time mentioned that he had been having exertional chest sure and shortness of breath with Chad previous 2 to 3 months.  He was referred to Dr. [name] (cardiology) for evaluation.  An EKG performed in Chad cardiologist's office showed sinus rhythm with a heart rate of 68 left axis deviation with LVH morphology.  Further workup included a coronary CT indicating a severe stenosis of Chad left main and proximal left anterior descending coronary arteries.  Further evaluation with left heart catheterization was recommended and was carried out earlier today by Dr. Susette Racer.  This study confirmed a 70% distal left main coronary artery stenosis extending into Chad ostia of Chad first diagonal, ramus intermediate, and circumflex coronary arteries.  Chad RCA had no significant obstructive disease.  Chad Gordon was referred to CT surgery for consideration of coronary bypass grafting. Currently, Chad Gordon is resting in bed in Chad cath lab recovery area. He denies any pain or shortness of breath.  He denies having any rest symptoms prior to admission.    Dr. Cliffton Asters evaluated Chad Gordon and all relevant studies and recommended proceeding with CABG.  Hospital course:  Following cardiac catheterization and medical stabilization he was felt to be stable to proceed With surgery and on  07/24/2023 he was taken to Chad operating room and underwent CABG x 2.  Chad following grafts were placed #1 LIMA-LAD and SVG-OM1.  He tolerated Chad procedure well was taken to Chad surgical intensive care unit in stable condition.  Postoperative hospital course:  Chad Gordon was extubated using standard post cardiac surgical protocols without difficulty.  He has remained neurologically intact.  He has remained hemodynamically stable and by postop day 1 he was not requiring any inotropes or vasopressors.  He has an expected volume overload related to surgery and was diuresed accordingly.  All routine lines, monitors and drainage devices have been discontinued in Chad standard fashion.  He developed an elevation in his creatinine level due to diuretics, thus they were discontinued.  He was transferred to Chad progressive care unit on 07/26/2023.  He remains in NSR.  He is hypertensive and will be resumed on home Benicar.  His surgical incisions are healing without evidence of infection.  He is medically stable for discharge home today.

## 2023-07-24 NOTE — Progress Notes (Signed)
  Echocardiogram Echocardiogram Transesophageal has been performed.  Delcie Roch 07/24/2023, 1:10 PM

## 2023-07-24 NOTE — Anesthesia Procedure Notes (Signed)
Procedure Name: Intubation Date/Time: 07/24/2023 8:59 AM  Performed by: April Holding, CRNAPre-anesthesia Checklist: Patient identified, Emergency Drugs available, Suction available and Patient being monitored Patient Re-evaluated:Patient Re-evaluated prior to induction Oxygen Delivery Method: Circle System Utilized Preoxygenation: Pre-oxygenation with 100% oxygen Induction Type: IV induction Ventilation: Mask ventilation without difficulty Laryngoscope Size: Miller and 2 Grade View: Grade I Tube type: Oral Tube size: 8.0 mm Number of attempts: 1 Airway Equipment and Method: Stylet and Oral airway Placement Confirmation: ETT inserted through vocal cords under direct vision, positive ETCO2 and breath sounds checked- equal and bilateral Secured at: 23 cm Tube secured with: Tape Dental Injury: Teeth and Oropharynx as per pre-operative assessment

## 2023-07-24 NOTE — Progress Notes (Signed)
     [address]       [place] [address]             [phone]       No events Vitals:   07/24/23 0749 07/24/23 0750  BP:  (!) 145/66  Pulse: 68 68  Resp: (!) 21 16  Temp:  98.6 F (37 C)  SpO2: 98% 98%   Alert NAD Sinus EWOB  OR today for CABG

## 2023-07-25 ENCOUNTER — Inpatient Hospital Stay (HOSPITAL_COMMUNITY): Payer: Medicare Other

## 2023-07-25 ENCOUNTER — Other Ambulatory Visit: Payer: Self-pay

## 2023-07-25 ENCOUNTER — Encounter (HOSPITAL_COMMUNITY): Payer: Self-pay | Admitting: Thoracic Surgery (Cardiothoracic Vascular Surgery)

## 2023-07-25 DIAGNOSIS — I10 Essential (primary) hypertension: Secondary | ICD-10-CM | POA: Diagnosis not present

## 2023-07-25 DIAGNOSIS — I2510 Atherosclerotic heart disease of native coronary artery without angina pectoris: Secondary | ICD-10-CM | POA: Diagnosis not present

## 2023-07-25 DIAGNOSIS — I25118 Atherosclerotic heart disease of native coronary artery with other forms of angina pectoris: Secondary | ICD-10-CM | POA: Diagnosis not present

## 2023-07-25 LAB — BASIC METABOLIC PANEL
Anion gap: 6 (ref 5–15)
Anion gap: 10 (ref 5–15)
BUN: 17 mg/dL (ref 8–23)
BUN: 21 mg/dL (ref 8–23)
CO2: 23 mmol/L (ref 22–32)
CO2: 26 mmol/L (ref 22–32)
Calcium: 8.4 mg/dL — ABNORMAL LOW (ref 8.9–10.3)
Calcium: 9 mg/dL (ref 8.9–10.3)
Chloride: 106 mmol/L (ref 98–111)
Chloride: 99 mmol/L (ref 98–111)
Creatinine, Ser: 0.97 mg/dL (ref 0.61–1.24)
Creatinine, Ser: 1.14 mg/dL (ref 0.61–1.24)
GFR, Estimated: >60 mL/min (ref >60)
GFR, Estimated: >60 mL/min (ref >60)
Glucose, Bld: 109 mg/dL — ABNORMAL HIGH (ref 70–99)
Glucose, Bld: 142 mg/dL — ABNORMAL HIGH (ref 70–99)
Potassium: 4.2 mmol/L (ref 3.5–5.1)
Potassium: 4.3 mmol/L (ref 3.5–5.1)
Sodium: 135 mmol/L (ref 135–145)
Sodium: 135 mmol/L (ref 135–145)

## 2023-07-25 LAB — CBC
HCT: 40.8 % (ref 39.0–52.0)
HCT: 42 % (ref 39.0–52.0)
Hemoglobin: 13.7 g/dL (ref 13.0–17.0)
Hemoglobin: 14 g/dL (ref 13.0–17.0)
MCH: 28.8 pg (ref 26.0–34.0)
MCH: 29 pg (ref 26.0–34.0)
MCHC: 33.6 g/dL (ref 30.0–36.0)
MCHC: 33.3 g/dL (ref 30.0–36.0)
MCV: 85.9 fL (ref 80.0–100.0)
MCV: 87.1 fL (ref 80.0–100.0)
Platelets: 284 10*3/uL (ref 150–400)
Platelets: 308 10*3/uL (ref 150–400)
RBC: 4.75 MIL/uL (ref 4.22–5.81)
RBC: 4.82 MIL/uL (ref 4.22–5.81)
RDW: 14.1 % (ref 11.5–15.5)
RDW: 14.3 % (ref 11.5–15.5)
WBC: 15.1 10*3/uL — ABNORMAL HIGH (ref 4.0–10.5)
WBC: 16.7 10*3/uL — ABNORMAL HIGH (ref 4.0–10.5)
nRBC: 0 % (ref 0.0–0.2)
nRBC: 0 % (ref 0.0–0.2)

## 2023-07-25 LAB — MAGNESIUM
Magnesium: 2.1 mg/dL (ref 1.7–2.4)
Magnesium: 1.8 mg/dL (ref 1.7–2.4)

## 2023-07-25 LAB — GLUCOSE, CAPILLARY
Glucose-Capillary: 108 mg/dL — ABNORMAL HIGH (ref 70–99)
Glucose-Capillary: 109 mg/dL — ABNORMAL HIGH (ref 70–99)
Glucose-Capillary: 112 mg/dL — ABNORMAL HIGH (ref 70–99)
Glucose-Capillary: 93 mg/dL (ref 70–99)

## 2023-07-25 MED ORDER — ENOXAPARIN SODIUM 30 MG/0.3ML IJ SOSY
30.0000 mg | PREFILLED_SYRINGE | Freq: Every day | INTRAMUSCULAR | Status: DC
  Administered 2023-07-25 – 2023-07-26 (×2): 30 mg via SUBCUTANEOUS
  Filled 2023-07-25 (×2): qty 0.3

## 2023-07-25 MED ORDER — INSULIN ASPART 100 UNIT/ML IJ SOLN: 0.0000 [IU] | INTRAMUSCULAR | Status: DC

## 2023-07-25 MED ORDER — FUROSEMIDE 10 MG/ML IJ SOLN
40.0000 mg | Freq: Once | INTRAMUSCULAR | Status: AC
  Administered 2023-07-25: 40 mg via INTRAVENOUS
  Filled 2023-07-25: qty 4

## 2023-07-25 MED ORDER — METOPROLOL TARTRATE 25 MG PO TABS
25.0000 mg | ORAL_TABLET | Freq: Two times a day (BID) | ORAL | Status: DC
  Administered 2023-07-25 – 2023-07-28 (×7): 25 mg via ORAL
  Filled 2023-07-25 (×8): qty 1

## 2023-07-25 NOTE — Progress Notes (Addendum)
[facility] DAILY ICU PROGRESS NOTE                   [address]            [place] [address]          [phone]   1 Day Post-Op Procedure(s) (LRB): CORONARY ARTERY BYPASS GRAFTING (CABG) x TWO, USING LEFT INTERNAL MAMMARY ARTERY & RIGHT LEG GREATER SAPHENOUS VEIN HARVESTED ENDOSCOPICALLY (N/A) TRANSESOPHAGEAL ECHOCARDIOGRAM (TEE) (N/A)  Total Length of Stay:  LOS: 8 days   Subjective: Some nausea  Objective: Vital signs in last 24 hours: Temp:  [95.7 F (35.4 C)-99.7 F (37.6 C)] 98.6 F (37 C) (12/19 0700) Pulse Rate:  [63-95] 95 (12/19 0700) Cardiac Rhythm: Normal sinus rhythm (12/19 0400) Resp:  [8-28] 18 (12/19 0700) BP: (108-145)/(51-97) 117/73 (12/19 0700) SpO2:  [90 %-100 %] 100 % (12/19 0700) Arterial Line BP: (100-156)/(36-92) 115/49 (12/19 0700) FiO2 (%):  [35 %-50 %] 35 % (12/18 1945) Weight:  [101.7 kg] 101.7 kg (12/19 0500)  Filed Weights   07/17/23 0833 07/25/23 0500  Weight: 102.5 kg 101.7 kg    Weight change:    Hemodynamic parameters for last 24 hours: CVP:  [0 mmHg-6 mmHg] 2 mmHg CO:  [5 L/min-8.5 L/min] 7.7 L/min CI:  [2.1 L/min/m2-3.7 L/min/m2] 3.3 L/min/m2  Intake/Output from previous day: 12/18 0701 - 12/19 0700 In: 4893.6 [P.O.:480; I.V.:2279.9; Blood:440; IV Piggyback:1693.7] Out: 3248 [Urine:2270; Blood:644; Chest Tube:334]  Intake/Output this shift: No intake/output data recorded.  Current Meds: Scheduled Meds:  acetaminophen  1,000 mg Oral Q6H   Or   acetaminophen (TYLENOL) oral liquid 160 mg/5 mL  1,000 mg Per Tube Q6H   aspirin EC  325 mg Oral Daily   Or   aspirin  324 mg Per Tube Daily   bisacodyl  10 mg Oral Daily   Or   bisacodyl  10 mg Rectal Daily   Chlorhexidine Gluconate Cloth  6 each Topical Daily   docusate sodium  200 mg Oral Daily   insulin aspart  0-24 Units Subcutaneous Q4H   metoCLOPramide (REGLAN) injection  10 mg Intravenous Q6H   metoprolol tartrate  12.5 mg Oral BID   Or   metoprolol  tartrate  12.5 mg Per Tube BID   [START ON 07/26/2023] pantoprazole  40 mg Oral Daily   pantoprazole (PROTONIX) IV  40 mg Intravenous QHS   rosuvastatin  40 mg Oral Daily   sodium chloride flush  10 mL Intravenous Q12H   sodium chloride flush  10-40 mL Intracatheter Q12H   sodium chloride flush  3 mL Intravenous Q12H   Continuous Infusions:  sodium chloride     sodium chloride     albumin human 999 mL/hr at 07/24/23 1700    ceFAZolin (ANCEF) IV Stopped (07/25/23 0549)   dexmedetomidine (PRECEDEX) IV infusion Stopped (07/24/23 1645)   DOBUTamine Stopped (07/24/23 1315)   lactated ringers     lactated ringers 20 mL/hr at 07/25/23 0630   niCARDipine Stopped (07/24/23 1315)   norepinephrine (LEVOPHED) Adult infusion Stopped (07/24/23 1315)   phenylephrine (NEO-SYNEPHRINE) Adult infusion Stopped (07/24/23 2222)   PRN Meds:.sodium chloride, albumin human, metoprolol tartrate, midazolam, morphine injection, ondansetron (ZOFRAN) IV, mouth rinse, oxyCODONE, sodium chloride flush, sodium chloride flush, traMADol  General appearance: alert, cooperative, and no distress Heart: regular rate and rhythm Lungs: dim left base, minor crackles Abdomen: benign Extremities: no edema Wound: incis healing well(EVH), chest dressing in place  Lab Results: CBC: Recent Labs  07/24/23 1958 07/25/23 0415  WBC 16.5* 15.1*  HGB 14.3 13.7  HCT 41.6 40.8  PLT 291 284   BMET:  Recent Labs    07/24/23 1958 07/25/23 0415  NA 136 135  K 4.5 4.2  CL 105 106  CO2 21* 23  GLUCOSE 128* 109*  BUN 20 17  CREATININE 1.01 0.97  CALCIUM 8.8* 8.4*    CMET: Lab Results  Component Value Date   WBC 15.1 (H) 07/25/2023   HGB 13.7 07/25/2023   HCT 40.8 07/25/2023   PLT 284 07/25/2023   GLUCOSE 109 (H) 07/25/2023   CHOL 103 07/21/2023   TRIG 73 07/21/2023   HDL 41 07/21/2023   LDLCALC 47 07/21/2023   ALT 25 09/18/2013   AST 20 09/18/2013   NA 135 07/25/2023   K 4.2 07/25/2023   CL 106 07/25/2023    CREATININE 0.97 07/25/2023   BUN 17 07/25/2023   CO2 23 07/25/2023   INR 1.3 (H) 07/24/2023   HGBA1C 5.4 07/21/2023      PT/INR:  Recent Labs    07/24/23 1356  LABPROT 16.3*  INR 1.3*   Radiology: DG Chest Port 1 View Result Date: 07/24/2023 CLINICAL DATA:  Status post CABG, history of hypertension EXAM: PORTABLE CHEST 1 VIEW COMPARISON:  07/22/2023 FINDINGS: Single frontal view of the chest demonstrates interval postsurgical changes from median sternotomy and CABG. Endotracheal tube overlies tracheal air column, tip at level of thoracic inlet. Right internal jugular catheter tip overlies superior vena cava. Mediastinal drain and left chest tube are noted. The cardiac silhouette is enlarged. Increased density at the left lung base consistent with consolidation and likely small left effusion. There may be a trace left apical pneumothorax, though evaluation is limited by overlying ventilator tubing. Volume estimated far less than 5%. No acute bony abnormalities. IMPRESSION: 1. Postsurgical changes from CABG, with left basilar atelectasis and small left effusion. 2. Equivocal findings for trace left apical pneumothorax, evaluation limited by overlying ventilator tubing. Volume estimated less than 5%. Close attention on follow-up recommended. These results will be called to the ordering clinician or representative by the Radiologist Assistant, and communication documented in the PACS or [employer]. Electronically Signed   By: Sharlet Salina M.D.   On: 07/24/2023 14:55     Assessment/Plan: S/P Procedure(s) (LRB): CORONARY ARTERY BYPASS GRAFTING (CABG) x TWO, USING LEFT INTERNAL MAMMARY ARTERY & RIGHT LEG GREATER SAPHENOUS VEIN HARVESTED ENDOSCOPICALLY (N/A) TRANSESOPHAGEAL ECHOCARDIOGRAM (TEE) (N/A) POD#1  1 Tmax 99.7, BP 108-145, excellent hemodynamics- no inotropes or vasopressors, sinus rhythm, likely remove a- line soon 2 O2 sats good on 5 liters 3 good UOP, weight slightly  above preop if accurate- will need some diuresis, normal renal fxn 4 Chest tube- 334 ml 5 CXR- some vascular fullness, likely remove after Pacer wires out 6 BS - good , usual protocols- not a diabetic 7 normal K+/Mg++ 8 likely reactive leukocytosis- trending lower 9 not anemic 10 routine pulm hygiene and rehab- will get PT consult to assist  Rowe Clack PA-C Pager [phone] 07/25/2023 7:13 AM  Agree with above Doing well Extubated off pressors Will remove pacing wires and A line Ambulation Possible floor today  Maidie Streight O Adriella Essex

## 2023-07-25 NOTE — Evaluation (Signed)
Physical Therapy Evaluation Patient Details Name: Chad Gordon MRN: [id number] DOB: [birth year] Today's Date: 07/25/2023  History of Present Illness  Patient is a 77 y/o male admitted 07/17/23 for cardiac cath and found to have significant stenosis and is now s/p CABG x2 on 07/24/23.  PMH positive for HTN, dyslipidemia, GERD, B UE elbow amputations in 1947, former tobacco use, B TKA 2015, back surgery 10/16/22 and post op PE.  Clinical Impression  Patient presents with decreased mobility due to generalized weakness, new sternal precautions and decreased activity tolerance.  Previously living alone completing all ADL/IADL's  Feel he can benefit from skilled PT in the acute setting and from follow up HHPT at d/c.  Reports his son will be staying with him initially.        If plan is discharge home, recommend the following: A little help with walking and/or transfers;Assistance with cooking/housework;Assist for transportation;A little help with bathing/dressing/bathroom;Help with stairs or ramp for entrance   Can travel by private vehicle        Equipment Recommendations Other (comment) (TBA)  Recommendations for Other Services       Functional Status Assessment Patient has had a recent decline in their functional status and demonstrates the ability to make significant improvements in function in a reasonable and predictable amount of time.     Precautions / Restrictions Precautions Precautions: Fall;Sternal      Mobility  Bed Mobility Overal bed mobility: Needs Assistance Bed Mobility: Supine to Sit     Supine to sit: HOB elevated, Contact guard     General bed mobility comments: cues for keeping pillow at chest , used arms some to scoot forward    Transfers Overall transfer level: Needs assistance Equipment used: Ambulation equipment used Transfers: Sit to/from Stand Sit to Stand: Min assist, +2 safety/equipment           General transfer comment: moves from  elevated bed to eva walker with A for balance and lines    Ambulation/Gait Ambulation/Gait assistance: Min assist, +2 safety/equipment Gait Distance (Feet): 120 Feet Assistive device: Fara Boros Gait Pattern/deviations: Step-through pattern, Decreased stride length       General Gait Details: assist for balance and lines with chair follow for safety  Stairs            Wheelchair Mobility     Tilt Bed    Modified Rankin (Stroke Patients Only)       Balance Overall balance assessment: Needs assistance   Sitting balance-Leahy Scale: Good     Standing balance support: Bilateral upper extremity supported, Reliant on assistive device for balance Standing balance-Leahy Scale: Poor Standing balance comment: UE support in standing on EVA walker                             Pertinent Vitals/Pain Pain Assessment Pain Assessment: 0-10 Pain Score: 2  Pain Location: chest Pain Descriptors / Indicators: Discomfort Pain Intervention(s): Monitored during session    Home Living Family/patient expects to be discharged to:: Private residence Living Arrangements: Alone Available Help at Discharge: Family;Available 24 hours/day (son planning to stay initially) Type of Home: House Home Access: Stairs to enter Entrance Stairs-Rails: Left Entrance Stairs-Number of Steps: 4   Home Layout: One level Home Equipment: Shower seat Additional Comments: married, but wife is in a nursing home    Prior Function Prior Level of Function : Independent/Modified Independent  Extremity/Trunk Assessment   Upper Extremity Assessment Upper Extremity Assessment: RUE deficits/detail;LUE deficits/detail RUE Deficits / Details: AROM shoulders WFL within precautions; amputations below elbow LUE Deficits / Details: AROM shoulders WFL within precautions; amputations below elbow    Lower Extremity Assessment Lower Extremity Assessment: Generalized  weakness       Communication   Communication Communication: No apparent difficulties  Cognition Arousal: Alert Behavior During Therapy: WFL for tasks assessed/performed Overall Cognitive Status: Within Functional Limits for tasks assessed                                          General Comments General comments (skin integrity, edema, etc.): VSS maintained on 2L O2 with ambulation    Exercises Other Exercises Other Exercises: issued incentive spirometer and pt performed x 5 reps up to   Assessment/Plan    PT Assessment Patient needs continued PT services  PT Problem List Decreased strength;Decreased balance;Pain;Decreased mobility;Decreased activity tolerance;Decreased knowledge of use of DME       PT Treatment Interventions DME instruction;Therapeutic activities;Cognitive remediation;Gait training;Therapeutic exercise;Patient/family education;Balance training    PT Goals (Current goals can be found in the Care Plan section)  Acute Rehab PT Goals Patient Stated Goal: return to independent PT Goal Formulation: With patient Time For Goal Achievement: 08/08/23 Potential to Achieve Goals: Good    Frequency Min 1X/week     Co-evaluation               AM-PAC PT "6 Clicks" Mobility  Outcome Measure Help needed turning from your back to your side while in a flat bed without using bedrails?: A Little Help needed moving from lying on your back to sitting on the side of a flat bed without using bedrails?: A Little Help needed moving to and from a bed to a chair (including a wheelchair)?: A Little Help needed standing up from a chair using your arms (e.g., wheelchair or bedside chair)?: A Lot Help needed to walk in hospital room?: A Lot Help needed climbing 3-5 steps with a railing? : Total 6 Click Score: 14    End of Session Equipment Utilized During Treatment: Oxygen Activity Tolerance: Patient limited by fatigue Patient left: in chair;with  chair alarm set;with call bell/phone within reach   PT Visit Diagnosis: Other abnormalities of gait and mobility (R26.89);Muscle weakness (generalized) (M62.81);Difficulty in walking, not elsewhere classified (R26.2)    Time: 0272-5366 PT Time Calculation (min) (ACUTE ONLY): 23 min   Charges:   PT Evaluation $PT Eval Moderate Complexity: 1 Mod PT Treatments $Gait Training: 8-22 mins PT General Charges $$ ACUTE PT VISIT: 1 Visit         Sheran Lawless, PT Acute Rehabilitation Services Office:[phone] 07/25/2023   Elray Mcgregor 07/25/2023, 1:31 PM

## 2023-07-25 NOTE — Plan of Care (Signed)
  Problem: Clinical Measurements: Goal: Diagnostic test results will improve Outcome: Progressing Goal: Cardiovascular complication will be avoided Outcome: Progressing   Problem: Activity: Goal: Risk for activity intolerance will decrease Outcome: Progressing   Problem: Cardiac: Goal: Will achieve and/or maintain hemodynamic stability Outcome: Progressing   Problem: Clinical Measurements: Goal: Postoperative complications will be avoided or minimized Outcome: Progressing   Problem: Respiratory: Goal: Respiratory status will improve Outcome: Progressing

## 2023-07-25 NOTE — Progress Notes (Signed)
NAME:  Chad Gordon, MRN:  [id number], DOB:  26-Oct-1945, LOS: 8 ADMISSION DATE:  07/17/2023, CONSULTATION DATE: 07/24/2023 REFERRING MD: Dr. 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  PCCM was consulted for help evaluation medical management  Pertinent  Medical History   Past Medical History:  Diagnosis Date   Abdominal discomfort 03/25/2023   03/25/2023: likely mechanical related to posture post-LSS surgery     Abnormality of gait and mobility 10/11/2022   Achilles tendinitis of right lower extremity 12/13/2020   Acute pulmonary embolism with acute cor pulmonale (HCC) 11/03/2022   10/29/2022: 3 weeks post-op back, ED with outpatient rx     Alteration in self-care ability 10/11/2022   Alteration of body temperature 11/20/2022   11/20/2022     Arthritis    "knees; ankles" (09/28/2013)   Arthritis of right ankle 12/13/2020   Atherosclerosis of both carotid arteries 09/08/2019   2021: CT neck     Atherosclerosis of vertebral artery 09/08/2019   2021: CT neck     Benign prostatic hyperplasia with weak urinary stream 05/22/2022   05/22/2022: UROL referral     Cervical radiculopathy 01/07/2018   Chronic rhinitis 11/17/2012   Followed in Pulmonary clinic/ [facility]/ [facility]   - Sinus CT  11/25/12 > . Minimal mucosal thickening within the inferolateral maxillary sinuses bilaterally.2. No other significant active or chronic sinus disease.           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   07/2021: vertical OS     GERD (gastroesophageal reflux disease)    takes Nexium daily   HBP (high blood pressure) 10/06/2012   Change to arb 10/06/2012      Hyperlipidemia    takes Pravastatin nightly   Hypertension    takes Azor daily   Joint pain    Joint swelling    Loose stools 09/25/2022   09/25/2022:     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 03/18/2018   2019: right     Mixed hyperlipidemia 10/20/2015   Muscle strain of gluteal region, right, initial encounter 03/12/2018   Neuritis 06/11/2017   2018: right elbow     Ocular migraine    "couple/year; just had one last week" (09/28/2013)   Other constipation 03/30/2016   2017     Other spondylosis with radiculopathy, lumbar region 11/24/2021   Added automatically from request for surgery [id number]     Precordial chest pain    Preoperative evaluation to rule out surgical contraindication 11/07/2021   11/07/2021: LSS     S/P total knee arthroplasty 09/28/2013   SOB (shortness of breath) 10/06/2012   Followed in Pulmonary clinic/ [facility]/ [facility]   - Try off acei effective 10/06/2012  - PFT's 11/17/2012 FEV1  3.01 (87%) ratio 67 and no better p  B2, DLCO 93%     Spinal stenosis, lumbar region with neurogenic claudication 11/08/2020   11/08/2020     Surgical wound present 10/16/2022   10/16/2022: LSS post back surgery     Thoracic aortic atherosclerosis (HCC) 09/08/2019   2021: CT neck     Vertigo 07/24/2017   2018     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     Significant Hospital Events: Including procedures, antibiotic start and stop dates in addition to other pertinent events     Interim History / Subjective:  Patient was successfully extubated per rapid weaning protocol Currently  on 5 L nasal cannula oxygen Phenylephrine was titrated off No overnight issues  Objective   Blood pressure 115/71, pulse 89, temperature 98.4 F (36.9 C), temperature source Bladder, resp. rate 17, height 6\' 3"  (1.905 m), weight 101.7 kg, SpO2 100%. CVP:  [0 mmHg-6 mmHg] 2 mmHg CO:  [5 L/min-8.5 L/min] 7.7 L/min CI:  [2.1 L/min/m2-3.7 L/min/m2] 3.3 L/min/m2  Vent Mode: PSV;CPAP FiO2 (%):  [35 %-50 %] 35 % Set Rate:  [4 bmp-16 bmp] 4 bmp Vt Set:  [670 mL] 670 mL PEEP:  [5 cmH20] 5 cmH20 Pressure Support:  [10 cmH20] 10 cmH20 Plateau Pressure:  [16 cmH20] 16 cmH20   Intake/Output Summary (Last 24 hours) at 07/25/2023 0815 Last data filed at 07/25/2023 0630 Gross per 24 hour  Intake 4893.6 ml  Output 3248 ml  Net 1645.6 ml   Filed Weights   07/17/23 0833 07/25/23 0500  Weight: 102.5 kg 101.7 kg    Examination: General: Elderly Male, sitting on recliner HEENT: [place]/AT, eyes anicteric.  moist mucus membranes Neuro: Alert, awake following commands Chest: Central sternotomy incision looks clean and dry pulm faint basal crackles, no wheezes.  Mediastinal and chest tube in place Heart: Regular rate and rhythm, no murmurs or gallops Abdomen: Soft, nontender, nondistended, bowel sounds present Skin: No rash  Labs and images reviewed  Chest tube output 334  Resolved Hospital Problem list     Assessment & Plan:  Coronary artery disease s/p CABG x 2 Continue aspirin and statin Chest tube management TCTS Chest tube output was 334 since coming to ICU for more Continue pain control with tramadol, oxycodone and morphine Closely monitor chest tube output  Acute respiratory insufficiency, postop Patient was recently extubated per rapid weaning protocol Encourage incentive spirometry and ambulation PT and OT today  Hypertension Blood pressure is controlled Holding antihypertensive meds  Hyperlipidemia Continue Crestor  Expected perioperative blood loss anemia Monitor  CBC  Hyponatremia, resolved Closely monitor electrolytes   Best Practice (right click and "Reselect all SmartList Selections" daily)   Diet/type: Regular consistency DVT prophylaxis: Subcu Lovenox GI prophylaxis: PPI Lines: Central line, Arterial Line, remove today Foley:  Yes, remove today Code Status:  full code Last date of multidisciplinary goals of care discussion [Per primary team]   Labs   CBC: Recent Labs  Lab 07/24/23 1136 07/24/23 1149 07/24/23 1356 07/24/23 1722 07/24/23 1846 07/24/23 1958 07/25/23 0415  WBC  --   --  15.4*  --   --  16.5* 15.1*  HGB 10.5*   < > 13.5 13.9 14.3 14.3 13.7  HCT 31.3*   < > 40.2 41.0 42.0 41.6 40.8  MCV  --   --  85.9  --   --  86.1 85.9  PLT 227  --  266  --   --  291 284   < > = values in this interval not displayed.  Basic Metabolic Panel: Recent Labs  Lab 07/19/23 1200 07/22/23 0848 07/23/23 0404 07/24/23 0921 07/24/23 1051 07/24/23 1116 07/24/23 1149 07/24/23 1226 07/24/23 1230 07/24/23 1353 07/24/23 1722 07/24/23 1846 07/24/23 1958 07/25/23 0415  NA 137 137 134*   < > 137   < > 132*   < > 133* 134* 136 137 136 135  K 4.0 4.3 3.8   < > 4.1   < > 5.6*   < > 5.2* 4.6 4.4 4.5 4.5 4.2  CL 106 106 105   < > 105  --  102  --  104  --   --   --  105 106  CO2 25 19* 22  --   --   --   --   --   --   --   --   --  21* 23  GLUCOSE 97 94 96   < > 103*  --  104*  --  117*  --   --   --  128* 109*  BUN 12 22 26*   < > 22  --  24*  --  22  --   --   --  20 17  CREATININE 0.93 0.99 1.14   < > 0.90  --  1.00  --  0.80  --   --   --  1.01 0.97  CALCIUM 9.4 9.3 9.1  --   --   --   --   --   --   --   --   --  8.8* 8.4*  MG  --   --   --   --   --   --   --   --   --   --   --   --  3.0* 2.1   < > = values in this interval not displayed.   GFR: Estimated Creatinine Clearance: 79.8 mL/min (by C-G formula based on SCr of 0.97 mg/dL). Recent Labs  Lab 07/24/23 1356 07/24/23 1958 07/25/23 0415  WBC 15.4* 16.5* 15.1*     Liver Function Tests: No results for input(s): "AST", "ALT", "ALKPHOS", "BILITOT", "PROT", "ALBUMIN" in the last 168 hours. No results for input(s): "LIPASE", "AMYLASE" in the last 168 hours. No results for input(s): "AMMONIA" in the last 168 hours.  ABG    Component Value Date/Time   PHART 7.299 (L) 07/24/2023 1846   PCO2ART 45.6 07/24/2023 1846   PO2ART 81 (L) 07/24/2023 1846   HCO3 22.3 07/24/2023 1846   TCO2 24 07/24/2023 1846   ACIDBASEDEF 4.0 (H) 07/24/2023 1846   O2SAT 94 07/24/2023 1846     Coagulation Profile: Recent Labs  Lab 07/21/23 1005 07/24/23 1356  INR 1.1 1.3*    Cardiac Enzymes: No results for input(s): "CKTOTAL", "CKMB", "CKMBINDEX", "TROPONINI" in the last 168 hours.  HbA1C: Hgb A1c MFr Bld  Date/Time Value Ref Range Status  07/21/2023 10:05 AM 5.4 4.8 - 5.6 % Final    Comment:    (NOTE) Pre diabetes:          5.7%-6.4%  Diabetes:              >6.4%  Glycemic control for   <7.0% adults with diabetes     CBG: Recent Labs  Lab 07/24/23 2217 07/25/23 0008 07/25/23 0108 07/25/23 0223 07/25/23 0413  GLUCAP 115* 109* 112* 93 108*      Cheri Fowler, MD [facility] See Amion for pager If no response to pager, please  call [phone] until 7pm After 7pm, Please call E-link [phone]

## 2023-07-25 NOTE — Progress Notes (Addendum)
   Patient Name: Chad Gordon Date of Encounter: 07/25/2023 [facility] Cardiologist: None   Interval Summary  .    POD 1 LIMA to LAD, VG to OM  MAP 70s, CO 72, CI 3.1  I/O +1645  Off pressors  Cr 0.97  Nauseated  Vital Signs .    Vitals:   07/25/23 0600 07/25/23 0653 07/25/23 0700 07/25/23 0800  BP:  113/72 117/73 115/71  Pulse: 92 85 95 89  Resp: 20 20 18 17   Temp:  98.6 F (37 C) 98.6 F (37 C) 98.4 F (36.9 C)  TempSrc:    Bladder  SpO2:  100% 100% 100%  Weight:      Height:        Intake/Output Summary (Last 24 hours) at 07/25/2023 0851 Last data filed at 07/25/2023 0800 Gross per 24 hour  Intake 4543.67 ml  Output 3308 ml  Net 1235.67 ml      07/25/2023    5:00 AM 07/17/2023    8:33 AM 07/10/2023    3:54 PM  Last 3 Weights  Weight (lbs) 224 lb 3.3 oz 226 lb 226 lb 9.6 oz  Weight (kg) 101.7 kg 102.513 kg 102.785 kg      Telemetry/ECG    Sinus Rhythm - Personally Reviewed  Physical Exam .   GEN: No acute distress.   Neck: No JVD Cardiac: RRR, sterna dressing in place Respiratory: Clear to auscultation bilaterally. GI: Soft, nontender, non-distended  MS: No edema, bilateral UE amp  Assessment & Plan .     77 yo male with PMH of HTN, HLD, GERD, PE post back surgery (completed 6 month course), elevated coronary calcium score who presented for outpatient cardiac cath.   CAD, multivessel S/p CABG; per CTS team  Cont ASA, statin, BB.  EF normal.   HTN BP well controlled today  Cont current therapy   HLD Continue Crestor.        Orbie Pyo, MD, [facility] 07/25/2023 8:51 AM

## 2023-07-26 ENCOUNTER — Inpatient Hospital Stay (HOSPITAL_COMMUNITY): Payer: Medicare Other

## 2023-07-26 DIAGNOSIS — I2510 Atherosclerotic heart disease of native coronary artery without angina pectoris: Secondary | ICD-10-CM | POA: Diagnosis not present

## 2023-07-26 LAB — ECHO INTRAOPERATIVE TEE
AV Mean grad: 3 mm[Hg]
AV Peak grad: 7.1 mm[Hg]
Ao pk vel: 1.33 m/s
Height: 75 in
S' Lateral: 3.1 cm
Weight: 3616 [oz_av]

## 2023-07-26 LAB — CBC
HCT: 39.1 % (ref 39.0–52.0)
Hemoglobin: 13 g/dL (ref 13.0–17.0)
MCH: 29 pg (ref 26.0–34.0)
MCHC: 33.2 g/dL (ref 30.0–36.0)
MCV: 87.3 fL (ref 80.0–100.0)
Platelets: 267 10*3/uL (ref 150–400)
RBC: 4.48 MIL/uL (ref 4.22–5.81)
RDW: 14.3 % (ref 11.5–15.5)
WBC: 13.3 10*3/uL — ABNORMAL HIGH (ref 4.0–10.5)
nRBC: 0 % (ref 0.0–0.2)

## 2023-07-26 LAB — BASIC METABOLIC PANEL
Anion gap: 7 (ref 5–15)
BUN: 22 mg/dL (ref 8–23)
CO2: 26 mmol/L (ref 22–32)
Calcium: 8.8 mg/dL — ABNORMAL LOW (ref 8.9–10.3)
Chloride: 101 mmol/L (ref 98–111)
Creatinine, Ser: 1.34 mg/dL — ABNORMAL HIGH (ref 0.61–1.24)
GFR, Estimated: 54 mL/min — ABNORMAL LOW (ref >60)
Glucose, Bld: 97 mg/dL (ref 70–99)
Potassium: 4 mmol/L (ref 3.5–5.1)
Sodium: 134 mmol/L — ABNORMAL LOW (ref 135–145)

## 2023-07-26 MED ORDER — SODIUM CHLORIDE 0.9% FLUSH
3.0000 mL | Freq: Two times a day (BID) | INTRAVENOUS | Status: DC
  Administered 2023-07-26 – 2023-07-28 (×3): 3 mL via INTRAVENOUS

## 2023-07-26 MED ORDER — SODIUM CHLORIDE 0.9 % IV SOLN: 250.0000 mL | INTRAVENOUS | Status: AC | PRN

## 2023-07-26 MED ORDER — SODIUM CHLORIDE 0.9% FLUSH: 3.0000 mL | INTRAVENOUS | Status: DC | PRN

## 2023-07-26 MED ORDER — ~~LOC~~ CARDIAC SURGERY, PATIENT & FAMILY EDUCATION: Freq: Once | Status: DC

## 2023-07-26 NOTE — Progress Notes (Signed)
   Patient Name: Chad Gordon Date of Encounter: 07/26/2023 [facility] Cardiologist: None   Interval Summary  .    POD 2 LIMA to LAD, VG to OM  Doing well, no complaints.  Cr up to 1.34    Vital Signs .    Vitals:   07/26/23 0500 07/26/23 0600 07/26/23 0700 07/26/23 0800  BP: 125/62 125/61 111/61 121/66  Pulse: 84 81 81 86  Resp: 16 (!) 9 18 20   Temp:      TempSrc:      SpO2: 97% 95% 96% 96%  Weight: 100 kg     Height:        Intake/Output Summary (Last 24 hours) at 07/26/2023 0900 Last data filed at 07/26/2023 0600 Gross per 24 hour  Intake 619.55 ml  Output 1435 ml  Net -815.45 ml      07/26/2023    5:00 AM 07/25/2023    5:00 AM 07/17/2023    8:33 AM  Last 3 Weights  Weight (lbs) 220 lb 7.4 oz 224 lb 3.3 oz 226 lb  Weight (kg) 100 kg 101.7 kg 102.513 kg      Telemetry/ECG    Sinus Rhythm - Personally Reviewed  Physical Exam .   GEN: No acute distress.   Neck: No JVD Cardiac: RRR, sterna dressing in place Respiratory: Clear to auscultation bilaterally. GI: Soft, nontender, non-distended  MS: No edema, bilateral UE amp  Assessment & Plan .     77 yo male with PMH of HTN, HLD, GERD, PE post back surgery (completed 6 month course), elevated coronary calcium score who presented for outpatient cardiac cath.   CAD, multivessel S/p CABG; per CTS team  Cont ASA, statin, BB.  EF normal.   HTN BP controlled today  Cont current therapy   HLD Continue Crestor.  AKI Monitor for now    Orbie Pyo, MD, [facility] 07/26/2023 9:00 AM

## 2023-07-26 NOTE — Plan of Care (Signed)
  Problem: Education: Goal: Understanding of CV disease, CV risk reduction, and recovery process will improve Outcome: Progressing Goal: Individualized Educational Video(s) Outcome: Progressing   Problem: Cardiovascular: Goal: Ability to achieve and maintain adequate cardiovascular perfusion will improve Outcome: Progressing   Problem: Health Behavior/Discharge Planning: Goal: Ability to safely manage health-related needs after discharge will improve Outcome: Progressing   Problem: Education: Goal: Knowledge of General Education information will improve Description: Including pain rating scale, medication(s)/side effects and non-pharmacologic comfort measures Outcome: Progressing   Problem: Health Behavior/Discharge Planning: Goal: Ability to manage health-related needs will improve Outcome: Progressing   Problem: Clinical Measurements: Goal: Will remain free from infection Outcome: Progressing Goal: Diagnostic test results will improve Outcome: Progressing Goal: Cardiovascular complication will be avoided Outcome: Progressing   Problem: Activity: Goal: Risk for activity intolerance will decrease Outcome: Progressing   Problem: Coping: Goal: Level of anxiety will decrease Outcome: Progressing   Problem: Elimination: Goal: Will not experience complications related to urinary retention Outcome: Progressing   Problem: Safety: Goal: Ability to remain free from injury will improve Outcome: Progressing   Problem: Skin Integrity: Goal: Risk for impaired skin integrity will decrease Outcome: Progressing   Problem: Education: Goal: Understanding of cardiac disease, CV risk reduction, and recovery process will improve Outcome: Progressing Goal: Individualized Educational Video(s) Outcome: Progressing   Problem: Activity: Goal: Ability to tolerate increased activity will improve Outcome: Progressing   Problem: Cardiac: Goal: Ability to achieve and maintain adequate  cardiovascular perfusion will improve Outcome: Progressing   Problem: Health Behavior/Discharge Planning: Goal: Ability to safely manage health-related needs after discharge will improve Outcome: Progressing   Problem: Education: Goal: Will demonstrate proper wound care and an understanding of methods to prevent future damage Outcome: Progressing Goal: Knowledge of disease or condition will improve Outcome: Progressing Goal: Knowledge of the prescribed therapeutic regimen will improve Outcome: Progressing Goal: Individualized Educational Video(s) Outcome: Progressing   Problem: Activity: Goal: Risk for activity intolerance will decrease Outcome: Progressing   Problem: Cardiac: Goal: Will achieve and/or maintain hemodynamic stability Outcome: Progressing   Problem: Clinical Measurements: Goal: Postoperative complications will be avoided or minimized Outcome: Progressing   Problem: Respiratory: Goal: Respiratory status will improve Outcome: Progressing   Problem: Skin Integrity: Goal: Wound healing without signs and symptoms of infection Outcome: Progressing Goal: Risk for impaired skin integrity will decrease Outcome: Progressing   Problem: Urinary Elimination: Goal: Ability to achieve and maintain adequate renal perfusion and functioning will improve Outcome: Progressing

## 2023-07-26 NOTE — Evaluation (Signed)
Occupational Therapy Evaluation/Discharge Patient Details Name: Chad Gordon MRN: [id number] DOB: 1946/07/21 Today's Date: 07/26/2023   History of Present Illness 77 y/o male admitted 07/17/23 for cardiac cath and found to have significant stenosis, s/p CABG x2 on 07/24/23.  PMHx: HTN, HLD, GERD, B UE elbow amputations in 1947, former tobacco use, Bil TKA, back surgery 10/16/22 and post op PE.   Clinical Impression   PTA, pt lives alone, typically ambulatory without AD and Modified Independent with ADLs, IADLs and driving using adaptive strategies in setting of prior B forearm amputations. Pt active at baseline. Educated re: sternal precautions, consideration for ADLs, IADL techniques and no driving. Pt able to manage all ADLs w/ Modified Independence using baseline strategies except UB dressing. Discussed potential modifications to this task though pt feels confident he can figure out a technique at home. Pt's son also in town to assist as needed. Encouraged trial of mobility without DME w/ staff to maximize independence for DC home. No further skilled OT services needed acutely or on DC.       If plan is discharge home, recommend the following: A little help with bathing/dressing/bathroom;Assist for transportation    Functional Status Assessment  Patient has had a recent decline in their functional status and demonstrates the ability to make significant improvements in function in a reasonable and predictable amount of time.  Equipment Recommendations  None recommended by OT    Recommendations for Other Services       Precautions / Restrictions Precautions Precautions: Fall;Sternal Restrictions Weight Bearing Restrictions Per Provider Order: No      Mobility Bed Mobility               General bed mobility comments: in chair on entry    Transfers                          Balance Overall balance assessment: No apparent balance deficits (not formally  assessed)                                         ADL either performed or assessed with clinical judgement   ADL Overall ADL's : Needs assistance/impaired Eating/Feeding: Modified independent   Grooming: Modified independent;Sitting;Standing   Upper Body Bathing: Modified independent;Adhering to UE precautions   Lower Body Bathing: Modified independent   Upper Body Dressing : Minimal assistance Upper Body Dressing Details (indicate cue type and reason): pt reports typically donning UE in shirt, lifting UE over head to don shirt and then bring UE out and down to pull over head successfully. initiated some problem solving in attempting to bring UE forward to don over head though pt reports son will be present to assist and pt confident he will find a strategy that works Lower Body Dressing: Modified independent   Toilet Transfer: Supervision/safety;Ambulation   Toileting- Clothing Manipulation and Hygiene: Modified independent               Vision Ability to See in Adequate Light: 0 Adequate Patient Visual Report: Other (comment) (does report some "clicking" when watching tv as if missing part of a scene or movements. per RN, likely due to gel they put in eyes in prep for surgery) Vision Assessment?: No apparent visual deficits Additional Comments: functional     Perception         Praxis  Pertinent Vitals/Pain Pain Assessment Pain Assessment: No/denies pain     Extremity/Trunk Assessment Upper Extremity Assessment Upper Extremity Assessment: RUE deficits/detail;LUE deficits/detail;Overall WFL for tasks assessed RUE Deficits / Details: AROM shoulders WFL within precautions; amputations below elbow LUE Deficits / Details: AROM shoulders WFL within precautions; amputations below elbow   Lower Extremity Assessment Lower Extremity Assessment: Defer to PT evaluation   Cervical / Trunk Assessment Cervical / Trunk Assessment: Normal    Communication Communication Communication: No apparent difficulties   Cognition Arousal: Alert Behavior During Therapy: WFL for tasks assessed/performed Overall Cognitive Status: Within Functional Limits for tasks assessed                                       General Comments       Exercises     Shoulder Instructions      Home Living Family/patient expects to be discharged to:: Private residence Living Arrangements: Alone Available Help at Discharge: Family;Available 24 hours/day (son planning to stay initially) Type of Home: House Home Access: Stairs to enter [employer] of Steps: 4 Entrance Stairs-Rails: Left Home Layout: One level     Bathroom Shower/Tub: Chief Strategy Officer: Handicapped height     Home Equipment: Information systems manager;Adaptive equipment Adaptive Equipment: Sock aid Additional Comments: married, but wife is in a nursing home      Prior Functioning/Environment Prior Level of Function : Independent/Modified Independent;Driving             Mobility Comments: no AD for mobility ADLs Comments: Modified Independent with all ADLs (uses sock aide, slip on shoes and pants with elastic waist band, able to perform hygiene though did not share technique with OT, uses shower chair), IADLs - likes to make spaghetti, (does have a cleaning lady), driving        OT Problem List:        OT Treatment/Interventions:      OT Goals(Current goals can be found in the care plan section) Acute Rehab OT Goals Patient Stated Goal: home soon, not need to use walker OT Goal Formulation: All assessment and education complete, DC therapy  OT Frequency:      Co-evaluation              AM-PAC OT "6 Clicks" Daily Activity     Outcome Measure Help from another person eating meals?: None Help from another person taking care of personal grooming?: None Help from another person toileting, which includes using toliet, bedpan, or  urinal?: None Help from another person bathing (including washing, rinsing, drying)?: None Help from another person to put on and taking off regular upper body clothing?: A Little Help from another person to put on and taking off regular lower body clothing?: None 6 Click Score: 23   End of Session Nurse Communication: Mobility status  Activity Tolerance: Patient tolerated treatment well Patient left: in chair;with call bell/phone within reach  OT Visit Diagnosis: Other abnormalities of gait and mobility (R26.89)                Time: 1016-1040 OT Time Calculation (min): 24 min Charges:  OT General Charges $OT Visit: 1 Visit OT Evaluation $OT Eval Low Complexity: 1 Low  Bradd Canary, OTR/L Acute Rehab Services Office: [phone]   Lorre Munroe 07/26/2023, 11:18 AM

## 2023-07-26 NOTE — Progress Notes (Signed)
Physical Therapy Treatment Patient Details Name: Chad Gordon MRN: [id number] DOB: 03-15-1946 Today's Date: 07/26/2023   History of Present Illness 77 y/o male admitted 07/17/23 for cardiac cath and found to have significant stenosis, s/p CABG x2 on 07/24/23.  PMHx: HTN, HLD, GERD, B UE elbow amputations in 1947, former tobacco use, Bil TKA, back surgery 10/16/22 and post op PE.    PT Comments  Pt very pleasant and reports he enjoys golf but has not played since back sx. Pt able to recall 2/4 precautions with education for all. Pt sleeps in chair at home and will not have to exit flat bed which is currently his most difficult mobility task. Pt educated for progressive gait and precautions. Hope to attempt gait without AD next session as well as stairs which pt deferred today.  HR 96-100 Pregait 123/70 (87), post gait 111/72 (79)  96% RA   If plan is discharge home, recommend the following: A little help with walking and/or transfers;Assistance with cooking/housework;Assist for transportation;A little help with bathing/dressing/bathroom;Help with stairs or ramp for entrance   Can travel by private vehicle        Equipment Recommendations  None recommended by PT    Recommendations for Other Services       Precautions / Restrictions Precautions Precautions: Fall;Sternal Restrictions Weight Bearing Restrictions Per Provider Order: Yes     Mobility  Bed Mobility Overal bed mobility: Modified Independent Bed Mobility: Supine to Sit     Supine to sit: HOB elevated     General bed mobility comments: HOB 35 degress with cues to maintain "in the tube". increased time and effort to scoot. Pt sleeps in recliner with ottoman at baseline    Transfers Overall transfer level: Modified independent                      Ambulation/Gait Ambulation/Gait assistance: Supervision Gait Distance (Feet): 400 Feet Assistive device: Fara Boros Gait Pattern/deviations: Step-through  pattern, Decreased stride length, Trunk flexed   Gait velocity interpretation: >2.62 ft/sec, indicative of community ambulatory   General Gait Details: cues for posture, pt denied attempting gait without EVA walker. Pt with elevated RW at home but also deferred trial with this AD today   Stairs             Wheelchair Mobility     Tilt Bed    Modified Rankin (Stroke Patients Only)       Balance Overall balance assessment: No apparent balance deficits (not formally assessed)                                          Cognition Arousal: Alert Behavior During Therapy: WFL for tasks assessed/performed Overall Cognitive Status: Within Functional Limits for tasks assessed                                          Exercises      General Comments        Pertinent Vitals/Pain Pain Assessment Pain Assessment: No/denies pain    Home Living                          Prior Function            PT Goals (  current goals can now be found in the care plan section) Progress towards PT goals: Progressing toward goals    Frequency    Min 1X/week      PT Plan      Co-evaluation              AM-PAC PT "6 Clicks" Mobility   Outcome Measure  Help needed turning from your back to your side while in a flat bed without using bedrails?: A Little Help needed moving from lying on your back to sitting on the side of a flat bed without using bedrails?: A Little Help needed moving to and from a bed to a chair (including a wheelchair)?: None Help needed standing up from a chair using your arms (e.g., wheelchair or bedside chair)?: None Help needed to walk in hospital room?: A Little Help needed climbing 3-5 steps with a railing? : A Little 6 Click Score: 20    End of Session   Activity Tolerance: Patient tolerated treatment well Patient left: in chair;with call bell/phone within reach Nurse Communication: Mobility  status PT Visit Diagnosis: Other abnormalities of gait and mobility (R26.89);Difficulty in walking, not elsewhere classified (R26.2)     Time: 9528-4132 PT Time Calculation (min) (ACUTE ONLY): 20 min  Charges:    $Gait Training: 8-22 mins PT General Charges $$ ACUTE PT VISIT: 1 Visit                     Merryl Hacker, PT Acute Rehabilitation Services Office: [phone]    Cristine Polio 07/26/2023, 10:54 AM

## 2023-07-26 NOTE — Progress Notes (Signed)
NAME:  Chad Gordon, MRN:  [id number], DOB:  Dec 13, 1945, LOS: 9 ADMISSION DATE:  07/17/2023, CONSULTATION DATE: 07/24/2023 REFERRING MD: Dr. 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  PCCM was consulted for help evaluation medical management  Pertinent  Medical History   Past Medical History:  Diagnosis Date   Abdominal discomfort 03/25/2023   03/25/2023: likely mechanical related to posture post-LSS surgery     Abnormality of gait and mobility 10/11/2022   Achilles tendinitis of right lower extremity 12/13/2020   Acute pulmonary embolism with acute cor pulmonale (HCC) 11/03/2022   10/29/2022: 3 weeks post-op back, ED with outpatient rx     Alteration in self-care ability 10/11/2022   Alteration of body temperature 11/20/2022   11/20/2022     Arthritis    "knees; ankles" (09/28/2013)   Arthritis of right ankle 12/13/2020   Atherosclerosis of both carotid arteries 09/08/2019   2021: CT neck     Atherosclerosis of vertebral artery 09/08/2019   2021: CT neck     Benign prostatic hyperplasia with weak urinary stream 05/22/2022   05/22/2022: UROL referral     Cervical radiculopathy 01/07/2018   Chronic rhinitis 11/17/2012   Followed in Pulmonary clinic/ [facility]/ [place]   - Sinus CT  11/25/12 > . Minimal mucosal thickening within the inferolateral maxillary sinuses bilaterally.2. No other significant active or chronic sinus disease.           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   07/2021: vertical OS     GERD (gastroesophageal reflux disease)    takes Nexium daily   HBP (high blood pressure) 10/06/2012   Change to arb 10/06/2012      Hyperlipidemia    takes Pravastatin nightly   Hypertension    takes Azor daily   Joint pain    Joint swelling    Loose stools 09/25/2022   09/25/2022:     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 03/18/2018   2019: right     Mixed hyperlipidemia 10/20/2015   Muscle strain of gluteal region, right, initial encounter 03/12/2018   Neuritis 06/11/2017   2018: right elbow     Ocular migraine    "couple/year; just had one last week" (09/28/2013)   Other constipation 03/30/2016   2017     Other spondylosis with radiculopathy, lumbar region 11/24/2021   Added automatically from request for surgery [id number]     Precordial chest pain    Preoperative evaluation to rule out surgical contraindication 11/07/2021   11/07/2021: LSS     S/P total knee arthroplasty 09/28/2013   SOB (shortness of breath) 10/06/2012   Followed in Pulmonary clinic/ [facility]/ [place]   - Try off acei effective 10/06/2012  - PFT's 11/17/2012 FEV1  3.01 (87%) ratio 67 and no better p  B2, DLCO 93%     Spinal stenosis, lumbar region with neurogenic claudication 11/08/2020   11/08/2020     Surgical wound present 10/16/2022   10/16/2022: LSS post back surgery     Thoracic aortic atherosclerosis (HCC) 09/08/2019   2021: CT neck     Vertigo 07/24/2017   2018     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     Significant Hospital Events: Including procedures, antibiotic start and stop dates in addition to other pertinent events     Interim History / Subjective:  Foley catheter malfunction, it was taken out Patient was retaining  urine, he put out 800 cc afterwards Remain off vasopressors Serum creatinine slightly elevated  Objective   Blood pressure 111/61, pulse 81, temperature 98.7 F (37.1 C), temperature source Oral, resp. rate 18, height 6\' 3"  (1.905 m), weight 100 kg, SpO2 96%. CVP:  [5 mmHg] 5 mmHg CO:  [6.2 L/min-7.7 L/min] 6.2 L/min CI:  [2.7 L/min/m2-3.3 L/min/m2] 2.7 L/min/m2      Intake/Output Summary (Last 24 hours) at 07/26/2023 0753 Last data filed at 07/26/2023 0600 Gross per 24 hour  Intake 769.62 ml  Output 1495 ml  Net -725.38 ml   Filed Weights   07/17/23 0833 07/25/23 0500 07/26/23 0500  Weight: 102.5 kg 101.7 kg 100 kg    Examination: General: Elderly male, lying on the bed HEENT: [place]/AT, eyes anicteric.  moist mucus membranes Neuro: Alert, awake following commands Chest: Central sternotomy incision looks clean and dry, coarse breath sounds, no wheezes or rhonchi.  Mediastinal and chest tube in place as well as pacer wire Heart: Regular rate and rhythm, no murmurs or gallops Abdomen: Soft, nontender, nondistended, bowel sounds present Skin: No rash Extremities: Bilateral traumatic amputation  Labs and images reviewed  Chest tube output 280 cc in last 24 hours  Resolved Hospital Problem list   Acute respiratory insufficiency, postop Hyponatremia  Assessment & Plan:  Coronary artery disease s/p CABG x 2 Continue aspirin and statin Chest tube management TCTS Chest tube output was 280cc in last 24 hours Continue pain control with tramadol, oxycodone and morphine Closely monitor chest tube output  Hypertension Continue metoprolol  Hyperlipidemia Continue Crestor  AKI, obstructive Patient had Foley malfunction, it was taken out, he had 800 cc urine output afterwards Will give 500 cc of fluid bolus Holding diuretics today Avoid nephrotoxic agents  Best Practice (right click and "Reselect all SmartList Selections" daily)   Diet/type: Regular consistency DVT  prophylaxis: Subcu Lovenox GI prophylaxis: PPI Lines: Central line, not needed anymore Foley: N/A Code Status:  full code Last date of multidisciplinary goals of care discussion [Per primary team]   Labs   CBC: Recent Labs  Lab 07/24/23 1356 07/24/23 1722 07/24/23 1846 07/24/23 1958 07/25/23 0415 07/25/23 1514 07/26/23 0530  WBC 15.4*  --   --  16.5* 15.1* 16.7* 13.3*  HGB 13.5   < > 14.3 14.3 13.7 14.0 13.0  HCT 40.2   < > 42.0 41.6 40.8 42.0 39.1  MCV 85.9  --   --  86.1 85.9 87.1 87.3  PLT 266  --   --  291 284 308 267   < > = values in this interval not displayed.    Basic Metabolic Panel: Recent Labs  Lab 07/23/23 0404 07/24/23 0921 07/24/23 1230 07/24/23 1353 07/24/23 1846 07/24/23 1958 07/25/23 0415 07/25/23 1514 07/26/23 0530  NA 134*   < > 133*   < > 137 136 135 135 134*  K  3.8   < > 5.2*   < > 4.5 4.5 4.2 4.3 4.0  CL 105   < > 104  --   --  105 106 99 101  CO2 22  --   --   --   --  21* 23 26 26   GLUCOSE 96   < > 117*  --   --  128* 109* 142* 97  BUN 26*   < > 22  --   --  20 17 21 22   CREATININE 1.14   < > 0.80  --   --  1.01 0.97 1.14 1.34*  CALCIUM 9.1  --   --   --   --  8.8* 8.4* 9.0 8.8*  MG  --   --   --   --   --  3.0* 2.1 1.8  --    < > = values in this interval not displayed.   GFR: Estimated Creatinine Clearance: 53.4 mL/min (A) (by C-G formula based on SCr of 1.34 mg/dL (H)). Recent Labs  Lab 07/24/23 1958 07/25/23 0415 07/25/23 1514 07/26/23 0530  WBC 16.5* 15.1* 16.7* 13.3*    Liver Function Tests: No results for input(s): "AST", "ALT", "ALKPHOS", "BILITOT", "PROT", "ALBUMIN" in the last 168 hours. No results for input(s): "LIPASE", "AMYLASE" in the last 168 hours. No results for input(s): "AMMONIA" in the last 168 hours.  ABG    Component Value Date/Time   PHART 7.299 (L) 07/24/2023 1846   PCO2ART 45.6 07/24/2023 1846   PO2ART 81 (L) 07/24/2023 1846   HCO3 22.3 07/24/2023 1846   TCO2 24 07/24/2023 1846   ACIDBASEDEF  4.0 (H) 07/24/2023 1846   O2SAT 94 07/24/2023 1846     Coagulation Profile: Recent Labs  Lab 07/21/23 1005 07/24/23 1356  INR 1.1 1.3*    Cardiac Enzymes: No results for input(s): "CKTOTAL", "CKMB", "CKMBINDEX", "TROPONINI" in the last 168 hours.  HbA1C: Hgb A1c MFr Bld  Date/Time Value Ref Range Status  07/21/2023 10:05 AM 5.4 4.8 - 5.6 % Final    Comment:    (NOTE) Pre diabetes:          5.7%-6.4%  Diabetes:              >6.4%  Glycemic control for   <7.0% adults with diabetes     CBG: Recent Labs  Lab 07/24/23 2217 07/25/23 0008 07/25/23 0108 07/25/23 0223 07/25/23 0413  GLUCAP 115* 109* 112* 93 108*      Cheri Fowler, MD [facility] See Amion for pager If no response to pager, please call [phone] until 7pm After 7pm, Please call E-link [phone]

## 2023-07-26 NOTE — Discharge Summary (Signed)
[address]       [place] [address]             [phone]    Physician Discharge Summary  Patient ID: Chad Gordon MRN: [id number] DOB/AGE: [birth year] 77 y.o.  Admit date: 07/17/2023 Discharge date: 07/28/2023  Admission Diagnoses:  Patient Active Problem List   Diagnosis Date Noted   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  CAD RADS 4 study with severe stenosis of left main and proximal LAD.  Mild stenosis in RCA and ramus intermedius branch.  LCx is a small artery 07/10/2023   Chest pain on exertion 06/28/2023   Diarrhea of presumed infectious origin 06/25/2023   Precordial pain 06/25/2023   Abdominal discomfort 03/25/2023   Alteration of body temperature 11/20/2022   Acute pulmonary embolism with acute cor pulmonale (HCC) 11/03/2022   Surgical wound present 10/16/2022   Abnormality of gait and mobility 10/11/2022   Alteration in self-care ability 10/11/2022   Complication of surgical procedure 10/10/2022   Loose stools 09/25/2022   Benign prostatic hyperplasia with weak urinary stream 05/22/2022   Other spondylosis with radiculopathy, lumbar region 11/24/2021   Preoperative evaluation to rule out surgical contraindication 11/07/2021   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   Achilles tendinitis of right lower extremity 12/13/2020   Arthritis of right ankle 12/13/2020   Spinal stenosis, lumbar region with neurogenic claudication 11/08/2020   COVID-19 determined by clinical diagnostic criteria 08/26/2020   Atherosclerosis of both carotid arteries 09/08/2019   Atherosclerosis of vertebral artery 09/08/2019   Thoracic aortic atherosclerosis (HCC) 09/08/2019   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 03/18/2018   Muscle strain of gluteal region, right, initial encounter 03/12/2018   Family history of stroke 01/30/2018   Cervical radiculopathy 01/07/2018   Claustrophobia 01/07/2018   Vertigo 07/24/2017   Neuritis  06/11/2017   Other constipation 03/30/2016   Gastroesophageal reflux disease without esophagitis 10/20/2015   Mixed hyperlipidemia 10/20/2015   S/P total knee arthroplasty 09/28/2013   Chronic rhinitis 11/17/2012   SOB (shortness of breath) 10/06/2012   Hypertension 10/06/2012   Discharge Diagnoses:  Patient Active Problem List   Diagnosis Date Noted   S/P CABG x 2 07/24/2023   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  CAD RADS 4 study with severe stenosis of left main and proximal LAD.  Mild stenosis in RCA and ramus intermedius branch.  LCx is a small artery 07/10/2023   Chest pain on exertion 06/28/2023   Diarrhea of presumed infectious origin 06/25/2023   Precordial pain 06/25/2023   Abdominal discomfort 03/25/2023   Alteration of body temperature 11/20/2022   Acute pulmonary embolism with acute cor pulmonale (HCC) 11/03/2022   Surgical wound present 10/16/2022   Abnormality of gait and mobility 10/11/2022   Alteration in self-care ability 10/11/2022   Complication of surgical procedure 10/10/2022   Loose stools 09/25/2022   Benign prostatic hyperplasia with weak urinary stream 05/22/2022   Other spondylosis with radiculopathy, lumbar region 11/24/2021   Preoperative evaluation to rule out surgical contraindication 11/07/2021   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   Achilles tendinitis of right lower extremity 12/13/2020   Arthritis of right ankle 12/13/2020   Spinal stenosis, lumbar region with neurogenic claudication 11/08/2020   COVID-19 determined by clinical diagnostic criteria 08/26/2020   Atherosclerosis of both carotid arteries 09/08/2019   Atherosclerosis of vertebral artery 09/08/2019   Thoracic aortic atherosclerosis (HCC) 09/08/2019  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 03/18/2018   Muscle strain of gluteal region, right, initial encounter 03/12/2018   Family history of stroke 01/30/2018   Cervical radiculopathy 01/07/2018    Claustrophobia 01/07/2018   Vertigo 07/24/2017   Neuritis 06/11/2017   Other constipation 03/30/2016   Gastroesophageal reflux disease without esophagitis 10/20/2015   Mixed hyperlipidemia 10/20/2015   S/P total knee arthroplasty 09/28/2013   Chronic rhinitis 11/17/2012   SOB (shortness of breath) 10/06/2012   Hypertension 10/06/2012   Discharged Condition: good  Referring Cardiologist:  Marykay Lex, MD  Primary Care: Olive Bass, MD   History of Present Illness:     Mr. 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  Back surgery in March of this year and has completed a 10-month treatment with oral anticoagulation.  Chad Gordon is retired and is married but lives alone as his wife is in a long-term care facility.  He was seen by his primary care physician last month and at that time mentioned that he had been having exertional chest sure and shortness of breath with the previous 2 to 3 months.  He was referred to Dr. Vincent Gordon (cardiology) for evaluation.  An EKG performed in the cardiologist's office showed sinus rhythm with a heart rate of 68 left axis deviation with LVH morphology.  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  Further evaluation with left heart catheterization was recommended and was carried out earlier today by Dr. Susette Gordon.  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  The RCA had no significant obstructive disease.  Chad Gordon was referred to CT surgery for consideration of coronary bypass grafting. Currently, Chad Gordon is resting in bed  in the cath lab recovery area. He denies any pain or shortness of breath.  He denies having any rest symptoms prior to admission.    Dr. Cliffton Gordon evaluated the patient and all relevant studies and recommended proceeding with CABG.  Hospital course:  Following cardiac catheterization and medical stabilization he was felt to be stable to proceed With surgery and on 07/24/2023 he was taken to the operating room and underwent CABG x 2.  The following grafts were placed #1 LIMA-LAD and SVG-OM1.  He tolerated the procedure well was taken to the surgical intensive care unit in stable condition.  Postoperative hospital course:  The patient was extubated using standard post cardiac surgical protocols without difficulty.  He has remained neurologically intact.  He has remained hemodynamically stable and by postop day 1 he was not requiring any inotropes or vasopressors.  He has an expected volume overload related to surgery and was diuresed accordingly.  All routine lines, monitors and drainage devices have been discontinued in the standard fashion.  He developed an elevation in his creatinine level due to diuretics, thus they were discontinued.  He was transferred to the progressive care unit on 07/26/2023.  He remains in NSR.  He is hypertensive and will be resumed on home Benicar.  His surgical incisions are healing without evidence of infection.  He is medically stable for discharge home today.  Consults: pulmonary/intensive care  Significant Diagnostic Studies:  ECHO INTRAOPERATIVE TEE Result  Date: 07/26/2023  *INTRAOPERATIVE TRANSESOPHAGEAL REPORT *  Patient Name:   EDIZ NEYLON Date of Exam: 07/24/2023 Medical Rec #:  [id number]      Height:       75.0 in Accession #:    [id number]     Weight:       226.0 lb Date of Birth:  02-20-1946     BSA:          2.31 m Patient Age:    77 years       BP:           138/70 mmHg Patient Gender: M              HR:           65 bpm. Exam Location:  Anesthesiology  Transesophogeal exam was perform intraoperatively during surgical procedure. Patient was closely monitored under general anesthesia during the entirety of examination. Indications:     I25.790 Atherosclerosis of other coronary artery bypass                  graft(s) with unstable angina pectoris; I25.110 Atherosclerotic                  heart disease of native coronary artery with unstable angina                  pectoris Sonographer:     Delcie Roch RDCS Performing Phys: Karna Christmas MD Diagnosing Phys: Karna Christmas MD Complications: No known complications during this procedure. POST-OP IMPRESSIONS _ Left Ventricle: The left ventricle is unchanged from pre-bypass. _ Right Ventricle: The right ventricle appears unchanged from pre-bypass. _ Aorta: The aorta appears unchanged from pre-bypass. _ Left Atrial Appendage: The left atrial appendage appears unchanged from pre-bypass. _ Aortic Valve: The aortic valve appears unchanged from pre-bypass. _ Mitral Valve: There is trivial regurgitation. _ Tricuspid Valve: There is trivial regurgitation. _ Pulmonic Valve: The pulmonic valve appears unchanged from pre-bypass. _ Interatrial Septum: The interatrial septum appears unchanged from pre-bypass. _ Pericardium: The pericardium appears unchanged from pre-bypass. PRE-OP FINDINGS  Left Ventricle: The left ventricle has normal systolic function, with an ejection fraction of 55-60%. The cavity size was normal. There is mildly increased left ventricular wall thickness. There is mild concentric left ventricular hypertrophy. Right Ventricle: The right ventricle has normal systolic function. The cavity was normal. There is no increase in right ventricular wall thickness. Left Atrium: Left atrial size was normal in size. No left atrial/left atrial appendage thrombus was detected. Right Atrium: Right atrial size was normal in size. Interatrial Septum: No atrial level shunt detected by color flow Doppler. Pericardium: There is  no evidence of pericardial effusion. Mitral Valve: The mitral valve is normal in structure. Mitral valve regurgitation is not visualized by color flow Doppler. Tricuspid Valve: The tricuspid valve was normal in structure. Tricuspid valve regurgitation was not visualized by color flow Doppler. Aortic Valve: The aortic valve is normal in structure. Aortic valve regurgitation was not visualized by color flow Doppler. There is no stenosis of the aortic valve. Pulmonic Valve: The pulmonic valve was normal in structure. Pulmonic valve regurgitation is not visualized by color flow Doppler. Aorta: The aortic root, ascending aorta and aortic arch are normal in size and structure. There is evidence of plaque in the descending aorta. +--------------+-------++ LEFT VENTRICLE        +--------------+-------++ PLAX 2D               +--------------+-------++ LVIDd:  4.50 cm +--------------+-------++ LVIDs:        3.10 cm +--------------+-------++ LV SV:        55 ml   +--------------+-------++ LV SV Index:  23.25   +--------------+-------++                       +--------------+-------++ +-------------+-----------++ AORTIC VALVE             +-------------+-----------++ AV Vmax:     133.00 cm/s +-------------+-----------++ AV Vmean:    86.100 cm/s +-------------+-----------++ AV VTI:      0.272 m     +-------------+-----------++ AV Peak Grad:7.1 mmHg    +-------------+-----------++ AV Mean Grad:3.0 mmHg    +-------------+-----------++  Karna Christmas MD Electronically signed by Karna Christmas MD Signature Date/Time: 07/26/2023/4:23:35 PM    Final    DG Chest Port 1 View Result Date: 07/26/2023 CLINICAL DATA:  Pneumothorax EXAM: PORTABLE CHEST 1 VIEW COMPARISON:  07/25/2023. FINDINGS: Stable right IJ catheter, mediastinal drain. Enlarged heart. Status post median sternotomy. Prominent central vasculature. Decreasing left lung base opacity. Persistent small left effusion.  Increasing right midlung atelectasis. No pneumothorax. Overlapping cardiac leads. IMPRESSION: Decreasing vascular congestion and left basilar opacity. Increasing right midlung atelectasis. No pneumothorax. Postop chest Electronically Signed   By: Karen Kays M.D.   On: 07/26/2023 10:01   DG Chest Port 1 View Result Date: 07/25/2023 CLINICAL DATA:  Status post coronary artery bypass graft. EXAM: PORTABLE CHEST 1 VIEW COMPARISON:  July 24, 2023. FINDINGS: Stable cardiomediastinal silhouette. Endotracheal and nasogastric tubes have been removed. Right internal jugular catheter is unchanged. Left basilar atelectasis or infiltrate is noted with associated effusion. No definite pneumothorax is noted currently. IMPRESSION: Endotracheal and nasogastric tubes have been removed. No definite pneumothorax is noted. Stable left lung opacity as noted above. Electronically Signed   By: Lupita Raider M.D.   On: 07/25/2023 09:54   DG Chest Port 1 View Result Date: 07/24/2023 CLINICAL DATA:  Status post CABG, history of hypertension EXAM: PORTABLE CHEST 1 VIEW COMPARISON:  07/22/2023 FINDINGS: Single frontal view of the chest demonstrates interval postsurgical changes from median sternotomy and CABG. Endotracheal tube overlies tracheal air column, tip at level of thoracic inlet. Right internal jugular catheter tip overlies superior vena cava. Mediastinal drain and left chest tube are noted. The cardiac silhouette is enlarged. Increased density at the left lung base consistent with consolidation and likely small left effusion. There may be a trace left apical pneumothorax, though evaluation is limited by overlying ventilator tubing. Volume estimated far less than 5%. No acute bony abnormalities. IMPRESSION: 1. Postsurgical changes from CABG, with left basilar atelectasis and small left effusion. 2. Equivocal findings for trace left apical pneumothorax, evaluation limited by overlying ventilator tubing. Volume estimated  less than 5%. Close attention on follow-up recommended. These results will be called to the ordering clinician or representative by the Radiologist Assistant, and communication documented in the PACS or [employer]. Electronically Signed   By: Sharlet Salina M.D.   On: 07/24/2023 14:55   DG Chest 2 View Result Date: 07/22/2023 CLINICAL DATA:  Medical clearance for surgery EXAM: CHEST - 2 VIEW COMPARISON:  01/02/2023 FINDINGS: Stable elevation of the left hemidiaphragm. Lungs are clear. No pneumothorax or pleural effusion. Cardiac size within normal limits. Pulmonary vascularity is normal. No acute bone abnormality. IMPRESSION: 1. No active cardiopulmonary disease. Electronically Signed   By: Helyn Numbers M.D.   On: 07/22/2023 19:20   VAS US DOPPLER PRE CABG Result  Date: 07/21/2023 PREOPERATIVE VASCULAR EVALUATION Patient Name:  ZIQUAN MUSCH  Date of Exam:   07/20/2023 Medical Rec #: [id number]       Accession #:    [id number] Date of Birth: [birth year]      Patient Gender: M Patient Age:   16 years Exam Location:  [facility] Procedure:      VAS US DOPPLER PRE CABG Referring Phys: HARRELL LIGHTFOOT --------------------------------------------------------------------------------  Indications:      Pre-CABG. Risk Factors:     Hypertension, hyperlipidemia, past history of smoking,                   coronary artery disease. Other Factors:    Bilateral upper extremity below elbow amputations in 1947. Limitations:      High bifurcation, bilateral upper extremity amputations Comparison Study: No prior study on file Performing Technologist: Sherren Kerns RVS  Examination Guidelines: A complete evaluation includes B-mode imaging, spectral Doppler, color Doppler, and power Doppler as needed of all accessible portions of each vessel. Bilateral testing is considered an integral part of a complete examination. Limited examinations for reoccurring indications may be performed as noted.  Right Carotid  Findings: +----------+--------+--------+--------+------------+------------------+           PSV cm/sEDV cm/sStenosisDescribe    Comments           +----------+--------+--------+--------+------------+------------------+ CCA Prox  101     10                          intimal thickening +----------+--------+--------+--------+------------+------------------+ CCA Distal124     13                          intimal thickening +----------+--------+--------+--------+------------+------------------+ ICA Prox  96      17      1-39%   heterogenous                   +----------+--------+--------+--------+------------+------------------+ ICA Mid   109     28                                             +----------+--------+--------+--------+------------+------------------+ ICA Distal89      21                                             +----------+--------+--------+--------+------------+------------------+ ECA       146     5                                              +----------+--------+--------+--------+------------+------------------+ +----------+--------+-------+--------+------------+           PSV cm/sEDV cmsDescribeArm Pressure +----------+--------+-------+--------+------------+ [id number]                                  +----------+--------+-------+--------+------------+ +---------+--------+--+--------+-+ VertebralPSV cm/s55EDV cm/s0 +---------+--------+--+--------+-+ Left Carotid Findings: +----------+--------+--------+--------+------------+------------------+           PSV cm/sEDV cm/sStenosisDescribe    Comments           +----------+--------+--------+--------+------------+------------------+ CCA Prox  130  18                          intimal thickening +----------+--------+--------+--------+------------+------------------+ CCA Distal125     15              heterogenous                    +----------+--------+--------+--------+------------+------------------+ ICA Prox  128     27      1-39%   heterogenous                   +----------+--------+--------+--------+------------+------------------+ ICA Mid   130     30                                             +----------+--------+--------+--------+------------+------------------+ ICA Distal142     31                                             +----------+--------+--------+--------+------------+------------------+ ECA       124     15                                             +----------+--------+--------+--------+------------+------------------+ +----------+--------+--------+--------+------------+ SubclavianPSV cm/sEDV cm/sDescribeArm Pressure +----------+--------+--------+--------+------------+           167                                  +----------+--------+--------+--------+------------+ +---------+--------+--+--------+--+ VertebralPSV cm/s78EDV cm/s20 +---------+--------+--+--------+--+  ABI Findings: +-----------+ Waveform    +-----------+ multiphasic +-----------+ multiphasic +-----------+ +-----------+ Waveform    +-----------+ multiphasic +-----------+ multiphasic +-----------+   Summary: Right Carotid: Velocities in the right ICA are consistent with a 1-39% stenosis. Left Carotid: Velocities in the left ICA are consistent with a 1-39% stenosis. Vertebrals:  Bilateral vertebral arteries demonstrate antegrade flow. Subclavians: Normal flow hemodynamics were seen in bilateral subclavian              arteries. Right Upper Extremity: Amputation. Left Upper Extremity: Amputation.  Electronically signed by Heath Lark on 07/21/2023 at 11:06:28 AM.    Final    ECHOCARDIOGRAM COMPLETE Result Date: 07/17/2023    ECHOCARDIOGRAM REPORT   Patient Name:   TEVAUGHN SANTIZO Date of Exam: 07/17/2023 Medical Rec #:  [id number]      Height:       75.0 in Accession #:    [id number]     Weight:        226.0 lb Date of Birth:  1946/06/05     BSA:          2.311 m Patient Age:    77 years       BP:           142/66 mmHg Patient Gender: M              HR:           81 bpm. Exam Location:  Inpatient Procedure: 2D Echo and Color Doppler Indications:    I25.110 Atherosclerotic heart disease of native coronary artery  with unstable angina pectoris  History:        Patient has prior history of Echocardiogram examinations, most                 recent 12/11/2022.  Sonographer:    Darlys Gales Referring Phys: [id number] HARRELL O LIGHTFOOT IMPRESSIONS  1. Left ventricular ejection fraction, by estimation, is 60 to 65%. The left ventricle has normal function. The left ventricle has no regional wall motion abnormalities. Left ventricular diastolic parameters were normal.  2. Right ventricular systolic function is normal. The right ventricular size is normal.  3. The mitral valve is grossly normal. Trivial mitral valve regurgitation. No evidence of mitral stenosis.  4. The aortic valve was not well visualized. Aortic valve regurgitation is not visualized. Aortic valve sclerosis is present, with no evidence of aortic valve stenosis.  5. The inferior vena cava is normal in size with greater than 50% respiratory variability, suggesting right atrial pressure of 3 mmHg. Comparison(s): Prior images unable to be directly viewed, comparison made by report only. Conclusion(s)/Recommendation(s): Normal biventricular function without evidence of hemodynamically significant valvular heart disease. FINDINGS  Left Ventricle: Left ventricular ejection fraction, by estimation, is 60 to 65%. The left ventricle has normal function. The left ventricle has no regional wall motion abnormalities. Definity contrast agent was given IV to delineate the left ventricular  endocardial borders. The left ventricular internal cavity size was normal in size. There is no left ventricular hypertrophy. Left ventricular diastolic parameters were  normal. Right Ventricle: The right ventricular size is normal. Right vetricular wall thickness was not well visualized. Right ventricular systolic function is normal. Left Atrium: Left atrial size was normal in size. Right Atrium: Right atrial size was normal in size. Pericardium: There is no evidence of pericardial effusion. Mitral Valve: The mitral valve is grossly normal. Mild mitral annular calcification. Trivial mitral valve regurgitation. No evidence of mitral valve stenosis. Tricuspid Valve: The tricuspid valve is not well visualized. Tricuspid valve regurgitation is trivial. No evidence of tricuspid stenosis. Aortic Valve: The aortic valve was not well visualized. Aortic valve regurgitation is not visualized. Aortic valve sclerosis is present, with no evidence of aortic valve stenosis. Aortic valve mean gradient measures 4.0 mmHg. Aortic valve peak gradient measures 6.7 mmHg. Aortic valve area, by VTI measures 2.92 cm. Pulmonic Valve: The pulmonic valve was not well visualized. Pulmonic valve regurgitation is not visualized. Aorta: The aortic root is normal in size and structure and the ascending aorta was not well visualized. Venous: The inferior vena cava is normal in size with greater than 50% respiratory variability, suggesting right atrial pressure of 3 mmHg. IAS/Shunts: The interatrial septum was not well visualized.  LEFT VENTRICLE PLAX 2D LVIDd:         4.20 cm   Diastology LVIDs:         2.80 cm   LV e' medial:    6.64 cm/s LV PW:         1.00 cm   LV E/e' medial:  10.9 LV IVS:        1.20 cm   LV e' lateral:   6.53 cm/s LVOT diam:     2.10 cm   LV E/e' lateral: 11.0 LV SV:         71 LV SV Index:   31 LVOT Area:     3.46 cm  RIGHT VENTRICLE RV S prime:     18.90 cm/s TAPSE (M-mode): 3.1 cm LEFT ATRIUM  Index        RIGHT ATRIUM           Index LA Vol (A2C):   38.5 ml 16.66 ml/m  RA Area:     10.20 cm LA Vol (A4C):   27.5 ml 11.90 ml/m  RA Volume:   20.40 ml  8.83 ml/m LA Biplane  Vol: 36.0 ml 15.58 ml/m  AORTIC VALVE AV Area (Vmax):    3.44 cm AV Area (Vmean):   2.73 cm AV Area (VTI):     2.92 cm AV Vmax:           129.00 cm/s AV Vmean:          99.900 cm/s AV VTI:            0.242 m AV Peak Grad:      6.7 mmHg AV Mean Grad:      4.0 mmHg LVOT Vmax:         128.00 cm/s LVOT Vmean:        78.700 cm/s LVOT VTI:          0.204 m LVOT/AV VTI ratio: 0.84  AORTA Ao Root diam: 3.10 cm MITRAL VALVE MV Area (PHT): 3.08 cm     SHUNTS MV Decel Time: 246 msec     Systemic VTI:  0.20 m MV E velocity: 72.10 cm/s   Systemic Diam: 2.10 cm MV A velocity: 118.00 cm/s MV E/A ratio:  0.61 Jodelle Red MD Electronically signed by Jodelle Red MD Signature Date/Time: 07/17/2023/7:39:09 PM    Final    CARDIAC CATHETERIZATION Result Date: 07/17/2023   Mid LM to Prox LAD lesion is 70% stenosed.   Ost Cx lesion is 45% stenosed.   The left ventricular systolic function is normal.   LV end diastolic pressure is normal.   The left ventricular ejection fraction is 55-65% by visual estimate.   There is no aortic valve stenosis. Severe distal Left Main-ostial LAD 70% stenosis involving high-D1/RI and small caliber LCx Apparently preserved LVEF by LV gram, but poor imaging-will order 2D Echo. Normal LVEDP RECOMMENDATIONS   Based on severe Left Main disease, the patient will be admitted to telemetry floor for CVTS consultation. Will initiate IV NTG for BP control and antianginal benefit. Will hold off on starting IV heparin unless he were to have anginal symptoms (throat pain) Titrate statin to rosuvastatin 40 mg daily Continue ARB and nebivolol along with aspirin. Check 2D Echo Recommend Aspirin 81mg  daily for moderate CAD. Preop workup per CVTS  Anticipated discharge date to be determined based on OR schedule Bryan Lemma, MD    Treatments: surgery:    07/24/2023 Patient:  [name] Pre-Op Dx: Left main coronary artery disease HTN   Post-op Dx:  same Procedure: CABG X 2.  LIMA  LAD, OM1   Endoscopic greater saphenous vein harvest on the right     Surgeon and Role:      * Lightfoot, Eliezer Lofts, MD - Primary    Webb Laws , PA-C - assisting  Discharge Exam: Blood pressure (!) 153/78, pulse 92, temperature 97.6 F (36.4 C), temperature source Oral, resp. rate 19, height 6\' 3"  (1.905 m), weight 99.3 kg, SpO2 99%.  General appearance: alert, cooperative, and no distress Heart: regular rate and rhythm Lungs: clear to auscultation bilaterally Abdomen: soft, non-tender; bowel sounds normal; no masses,  no organomegaly Extremities: edema trace Wound: clean and dry  Discharge Medications:  The patient has been discharged on:   1.Beta Blocker:  Yes [ X  ]                              No   [   ]                              If No, reason:  2.Ace Inhibitor/ARB: Yes [ X  ]                                     No  [    ]                                     If No, reason:  3.Statin:   Yes [ X  ]                  No  [   ]                  If No, reason:  4.Ecasa:  Yes  [  X ]                  No   [   ]                  If No, reason:  Patient had ACS upon admission: No  Plavix/P2Y12 inhibitor: Yes [   ]                                      No  [ X  ]     Discharge Instructions     Amb Referral to Cardiac Rehabilitation   Complete by: As directed    Patient lives in [place]. Referral sent to [facility].   Diagnosis: CABG   CABG X ___: 2   After initial evaluation and assessments completed: Virtual Based Care may be provided alone or in conjunction with Phase 2 Cardiac Rehab based on patient barriers.: Yes   Intensive Cardiac Rehabilitation (ICR) [facility] location only OR Traditional Cardiac Rehabilitation (TCR) *If criteria for ICR are not met will enroll in TCR [facility] only): Yes      Allergies as of 07/28/2023       Reactions   Linaclotide Diarrhea   Linzess        Medication List     STOP taking these medications    nebivolol 10 MG  tablet Commonly known as: BYSTOLIC       TAKE these medications    acetaminophen 500 MG tablet Commonly known as: TYLENOL Take 1-2 tablets (500-1,000 mg total) by mouth every 6 (six) hours as needed.   aspirin EC 81 MG tablet Take 1 tablet (81 mg total) by mouth daily. Swallow whole.   metoprolol tartrate 25 MG tablet Commonly known as: LOPRESSOR Take 1 tablet (25 mg total) by mouth 2 (two) times daily.   olmesartan 40 MG tablet Commonly known as: BENICAR Take 40 mg by mouth daily.   omeprazole 20 MG capsule Commonly known as: PRILOSEC Take 1 capsule (20 mg total) by mouth daily.   rosuvastatin 40 MG tablet Commonly known as: CRESTOR Take 1 tablet (  40 mg total) by mouth daily. What changed:  medication strength how much to take   traMADol 50 MG tablet Commonly known as: ULTRAM Take 1 tablet (50 mg total) by mouth every 6 (six) hours as needed for moderate pain (pain score 4-6).        Follow-up Information     Corliss Skains, MD Follow up on 08/09/2023.   Specialty: Cardiothoracic Surgery Why: Your first visit will be a virtual visit.  Your appointment time is 2:10.   Do not come to the office.  Subsequent visits will be in person. Contact information: [address] [place] [address] [phone]         Flossie Dibble, NP Follow up on 08/09/2023.   Specialty: Cardiology Why: Appointment is at 8:50 Contact information: [address] [place] [address] [phone]         [facility] Follow up.   Why: Office will Gordon to follow up after hospital discharge                Signed:  Lowella Dandy, PA-C  07/28/2023, 8:21 AM

## 2023-07-26 NOTE — TOC Progression Note (Signed)
Transition of Care [facility]) - Progression Note    Patient Details  Name: Raza Fristoe MRN: [id number] Date of Birth: 10/05/1945  Transition of Care [facility]) CM/SW Contact  Graves-Bigelow, Lamar Laundry, RN Phone Number: 07/26/2023, 11:33 AM  Clinical Narrative: Patient was discussed in morning progression rounds-POD 2 CABG. Patient will be followed up by outpatient protocol with [facility] PT/OT. Patient is agreeable to services. Patient states son has arrived from [place] for support. Case Manager will continue to follow for DME needs as the patient progresses.     Expected Discharge Plan: Home w Home Health Services Barriers to Discharge: Continued Medical Work up  Expected Discharge Plan and Services   Discharge Planning Services: CM Consult Post Acute Care Choice: Home Health Living arrangements for the past 2 months: Apartment  Social Determinants of Health (SDOH) Interventions SDOH Screenings   Food Insecurity: No Food Insecurity (07/17/2023)  Housing: Low Risk  (07/18/2023)  Transportation Needs: No Transportation Needs (07/17/2023)  Utilities: Not At Risk (07/17/2023)  Tobacco Use: Medium Risk (07/24/2023)   Readmission Risk Interventions     No data to display

## 2023-07-26 NOTE — Progress Notes (Addendum)
2 Days Post-Op Procedure(s) (LRB): CORONARY ARTERY BYPASS GRAFTING (CABG) x TWO, USING LEFT INTERNAL MAMMARY ARTERY & RIGHT LEG GREATER SAPHENOUS VEIN HARVESTED ENDOSCOPICALLY (N/A) TRANSESOPHAGEAL ECHOCARDIOGRAM (TEE) (N/A) Subjective: Feels well, some soreness  Objective: Vital signs in last 24 hours: Temp:  [97.6 F (36.4 C)-98.7 F (37.1 C)] 98.7 F (37.1 C) (12/20 0404) Pulse Rate:  [74-108] 81 (12/20 0700) Cardiac Rhythm: Normal sinus rhythm (12/20 0400) Resp:  [9-24] 18 (12/20 0700) BP: (92-139)/(51-90) 111/61 (12/20 0700) SpO2:  [90 %-100 %] 96 % (12/20 0700) Arterial Line BP: (129)/(53) 129/53 (12/19 0900) Weight:  [100 kg] 100 kg (12/20 0500)  Hemodynamic parameters for last 24 hours: CVP:  [5 mmHg] 5 mmHg CO:  [6.2 L/min] 6.2 L/min CI:  [2.7 L/min/m2] 2.7 L/min/m2  Intake/Output from previous day: 12/19 0701 - 12/20 0700 In: 769.6 [P.O.:330; I.V.:59.6; IV Piggyback:300] Out: 1495 [Urine:1215; Chest Tube:280] Intake/Output this shift: No intake/output data recorded.  General appearance: alert, cooperative, and no distress Heart: regular rate and rhythm Lungs: clear to auscultation bilaterally Abdomen: benign Extremities: no edema Wound: incis  Lab Results: Recent Labs    07/25/23 1514 07/26/23 0530  WBC 16.7* 13.3*  HGB 14.0 13.0  HCT 42.0 39.1  PLT 308 267   BMET:  Recent Labs    07/25/23 1514 07/26/23 0530  NA 135 134*  K 4.3 4.0  CL 99 101  CO2 26 26  GLUCOSE 142* 97  BUN 21 22  CREATININE 1.14 1.34*  CALCIUM 9.0 8.8*    PT/INR:  Recent Labs    07/24/23 1356  LABPROT 16.3*  INR 1.3*   ABG    Component Value Date/Time   PHART 7.299 (L) 07/24/2023 1846   HCO3 22.3 07/24/2023 1846   TCO2 24 07/24/2023 1846   ACIDBASEDEF 4.0 (H) 07/24/2023 1846   O2SAT 94 07/24/2023 1846   CBG (last 3)  Recent Labs    07/25/23 0108 07/25/23 0223 07/25/23 0413  GLUCAP 112* 93 108*    Meds Scheduled Meds:  acetaminophen  1,000 mg Oral  Q6H   Or   acetaminophen (TYLENOL) oral liquid 160 mg/5 mL  1,000 mg Per Tube Q6H   aspirin EC  325 mg Oral Daily   Or   aspirin  324 mg Per Tube Daily   bisacodyl  10 mg Oral Daily   Or   bisacodyl  10 mg Rectal Daily   Chlorhexidine Gluconate Cloth  6 each Topical Daily   docusate sodium  200 mg Oral Daily   enoxaparin (LOVENOX) injection  30 mg Subcutaneous QHS   metoprolol tartrate  25 mg Oral BID   pantoprazole  40 mg Oral Daily   rosuvastatin  40 mg Oral Daily   Continuous Infusions:  albumin human 999 mL/hr at 07/24/23 1700   PRN Meds:.albumin human, metoprolol tartrate, midazolam, morphine injection, ondansetron (ZOFRAN) IV, mouth rinse, oxyCODONE, traMADol  Xrays DG Chest Port 1 View Result Date: 07/25/2023 CLINICAL DATA:  Status post coronary artery bypass graft. EXAM: PORTABLE CHEST 1 VIEW COMPARISON:  July 24, 2023. FINDINGS: Stable cardiomediastinal silhouette. Endotracheal and nasogastric tubes have been removed. Right internal jugular catheter is unchanged. Left basilar atelectasis or infiltrate is noted with associated effusion. No definite pneumothorax is noted currently. IMPRESSION: Endotracheal and nasogastric tubes have been removed. No definite pneumothorax is noted. Stable left lung opacity as noted above. Electronically Signed   By: Lupita Raider M.D.   On: 07/25/2023 09:54   DG Chest Port 1 View Result Date:  07/24/2023 CLINICAL DATA:  Status post CABG, history of hypertension EXAM: PORTABLE CHEST 1 VIEW COMPARISON:  07/22/2023 FINDINGS: Single frontal view of the chest demonstrates interval postsurgical changes from median sternotomy and CABG. Endotracheal tube overlies tracheal air column, tip at level of thoracic inlet. Right internal jugular catheter tip overlies superior vena cava. Mediastinal drain and left chest tube are noted. The cardiac silhouette is enlarged. Increased density at the left lung base consistent with consolidation and likely small left  effusion. There may be a trace left apical pneumothorax, though evaluation is limited by overlying ventilator tubing. Volume estimated far less than 5%. No acute bony abnormalities. IMPRESSION: 1. Postsurgical changes from CABG, with left basilar atelectasis and small left effusion. 2. Equivocal findings for trace left apical pneumothorax, evaluation limited by overlying ventilator tubing. Volume estimated less than 5%. Close attention on follow-up recommended. These results will be called to the ordering clinician or representative by the Radiologist Assistant, and communication documented in the PACS or [employer]. Electronically Signed   By: Sharlet Salina M.D.   On: 07/24/2023 14:55    Assessment/Plan: S/P Procedure(s) (LRB): CORONARY ARTERY BYPASS GRAFTING (CABG) x TWO, USING LEFT INTERNAL MAMMARY ARTERY & RIGHT LEG GREATER SAPHENOUS VEIN HARVESTED ENDOSCOPICALLY (N/A) TRANSESOPHAGEAL ECHOCARDIOGRAM (TEE) (N/A)  POD#2  1 afeb, VSS, SR 2 O2 sats good on RA 3 Good UOP, weight trending lower, appears below preop, creat did bump to 1.34 4 CT 280 ml- likely d/c today 5 leukocytosis trending lower 6 not anemic 7 BS well controlled 8 CXR- + atx 9 routine pulm hygiene and rehab 10 lovenox for DVT ppx 11 likely tx to 4 e      LOS: 9 days    Rowe Clack PA-C Pager [phone] 07/26/2023   Agree with above Doing well Floor today  Corliss Skains

## 2023-07-26 NOTE — Progress Notes (Signed)
PT Cancellation Note  Patient Details Name: Chad Gordon MRN: [id number] DOB: October 31, 1945   Cancelled Treatment:    Reason Eval/Treat Not Completed: Other (comment) (RN requests short deferral to pull tubes.Will reattempt)   Garald Rhew B Feliberto Stockley 07/26/2023, 8:32 AM Merryl Hacker, PT Acute Rehabilitation Services Office: [phone]

## 2023-07-26 NOTE — Progress Notes (Signed)
Admission: Transfer from [facility] A&Ox[facility].  CCMD notified.  Vital signs. Oriented to room.  Call be within reach.

## 2023-07-27 NOTE — Progress Notes (Signed)
      [address]       [place] [address]             [phone]      3 Days Post-Op Procedure(s) (LRB): CORONARY ARTERY BYPASS GRAFTING (CABG) x TWO, USING LEFT INTERNAL MAMMARY ARTERY & RIGHT LEG GREATER SAPHENOUS VEIN HARVESTED ENDOSCOPICALLY (N/A) TRANSESOPHAGEAL ECHOCARDIOGRAM (TEE) (N/A)  Subjective:  Patient sitting up in bed.  Overall doing okay, continues to have some discomfort.  Ambulating  + BM  Objective: Vital signs in last 24 hours: Temp:  [97.5 F (36.4 C)-98.4 F (36.9 C)] 97.5 F (36.4 C) (12/21 0525) Pulse Rate:  [84-98] 95 (12/21 0525) Cardiac Rhythm: Normal sinus rhythm;Sinus tachycardia (12/20 2100) Resp:  [18-26] 20 (12/21 0525) BP: (111-148)/(50-108) 143/73 (12/21 0525) SpO2:  [92 %-100 %] 92 % (12/21 0525) Weight:  [100.9 kg] 100.9 kg (12/21 0525)  Intake/Output from previous day: 12/20 0701 - 12/21 0700 In: 360 [P.O.:360] Out: 700 [Urine:700]  General appearance: alert, cooperative, and no distress Heart: regular rate and rhythm Lungs: diminished breath sounds bibasilar Abdomen: soft, non-tender; bowel sounds normal; no masses,  no organomegaly Extremities: edema trace Wound: clean and dry, blister along top portion of sternotomy  Lab Results: Recent Labs    07/25/23 1514 07/26/23 0530  WBC 16.7* 13.3*  HGB 14.0 13.0  HCT 42.0 39.1  PLT 308 267   BMET:  Recent Labs    07/25/23 1514 07/26/23 0530  NA 135 134*  K 4.3 4.0  CL 99 101  CO2 26 26  GLUCOSE 142* 97  BUN 21 22  CREATININE 1.14 1.34*  CALCIUM 9.0 8.8*    PT/INR:  Recent Labs    07/24/23 1356  LABPROT 16.3*  INR 1.3*   ABG    Component Value Date/Time   PHART 7.299 (L) 07/24/2023 1846   HCO3 22.3 07/24/2023 1846   TCO2 24 07/24/2023 1846   ACIDBASEDEF 4.0 (H) 07/24/2023 1846   O2SAT 94 07/24/2023 1846   CBG (last 3)  Recent Labs    07/25/23 0108 07/25/23 0223 07/25/23 0413  GLUCAP 112* 93 108*    Assessment/Plan: S/P  Procedure(s) (LRB): CORONARY ARTERY BYPASS GRAFTING (CABG) x TWO, USING LEFT INTERNAL MAMMARY ARTERY & RIGHT LEG GREATER SAPHENOUS VEIN HARVESTED ENDOSCOPICALLY (N/A) TRANSESOPHAGEAL ECHOCARDIOGRAM (TEE) (N/A)  CV- NSR, BP slightly elevated at times- continue Lopressor, if persists will add low dose Cozaar Pulm- off oxygen, CXR with some atelectasis, no significant effusions, continue IS Renal- weight is at baseline, minimal edema on exam.. not currently on Lasix, repeat BMET in AM  Deconditioning- mild, home PT recommended, orders placed Dispo- patient doing well, may need to add anti-hypertensive if BP increases, continue IS.Marland Kitchen patient likely for d/c in next 24-48 hours.   LOS: 10 days    Lowella Dandy, PA-C 07/27/2023

## 2023-07-27 NOTE — Progress Notes (Signed)
Pt ambulated with four wheel walker around unit x 350 feet

## 2023-07-27 NOTE — Progress Notes (Signed)
Pt is refusing am labs states" they only stick my feet not my arms". Foot stick order was d/cd on 18 dec 24 and had a sleeve until yesterday. I encouraged pt to let lab get his blood from his arm, pt continues to refuse will pass on to day shift for potential of re-obtaining a foot stick order

## 2023-07-27 NOTE — Discharge Instructions (Signed)
Discharge Instructions:  1. You may shower, please wash incisions daily with soap and water and keep dry.  If you wish to cover wounds with dressing you may do so but please keep clean and change daily.  No tub baths or swimming until incisions have completely healed.  If your incisions become red or develop any drainage please call our office at [phone]  2. No Driving until cleared by Dr. Lightfoot's office and you are no longer using narcotic pain medications  3. Monitor your weight daily.. Please use the same scale and weigh at same time... If you gain 5-10 lbs in 48 hours with associated lower extremity swelling, please contact our office at [phone]  4. Fever of 101.5 for at least 24 hours with no source, please contact our office at [phone]  5. Activity- up as tolerated, please walk at least 3 times per day.  Avoid strenuous activity, no lifting, pushing, or pulling with your arms over 8-10 lbs for a minimum of 6 weeks  6. If any questions or concerns arise, please do not hesitate to contact our office at [phone] 

## 2023-07-27 NOTE — Progress Notes (Signed)
CARDIAC REHAB PHASE [phone] Patient states he walked this morning with his nurse Jorja Loa and is not ready to walk again at this time. Encouraged him to walk again with the nursing staff with a goal of three walk/ day, and he's amenable to this. Reviewed [facility]  education including sternal precautions, restrictions, IS use, heart healthy eating, and activity progression. He is very receptive to information provided. He's already viewed the Recovery From [facility] video. Heart Healthy diet and Exercise handouts given. Discussed phase 2 cardiac rehab, and he is interested in the program at [facility]. Will send referral to cardiac rehab program at [place]. Patient verbalizes understanding of instructions given.  Artist Pais, MS, ACSM CEP 07/27/23 [phone]

## 2023-07-28 LAB — BASIC METABOLIC PANEL
Anion gap: 11 (ref 5–15)
BUN: 16 mg/dL (ref 8–23)
CO2: 21 mmol/L — ABNORMAL LOW (ref 22–32)
Calcium: 8.6 mg/dL — ABNORMAL LOW (ref 8.9–10.3)
Chloride: 102 mmol/L (ref 98–111)
Creatinine, Ser: 0.86 mg/dL (ref 0.61–1.24)
GFR, Estimated: >60 mL/min (ref >60)
Glucose, Bld: 94 mg/dL (ref 70–99)
Potassium: 3.9 mmol/L (ref 3.5–5.1)
Sodium: 134 mmol/L — ABNORMAL LOW (ref 135–145)

## 2023-07-28 MED ORDER — TRAMADOL HCL 50 MG PO TABS: 50.0000 mg | ORAL_TABLET | Freq: Four times a day (QID) | ORAL | 0 refills | Status: DC | PRN

## 2023-07-28 MED ORDER — METOPROLOL TARTRATE 25 MG PO TABS: 25.0000 mg | ORAL_TABLET | Freq: Two times a day (BID) | ORAL | 3 refills | Status: DC

## 2023-07-28 MED ORDER — ROSUVASTATIN CALCIUM 40 MG PO TABS: 40.0000 mg | ORAL_TABLET | Freq: Every day | ORAL | 3 refills | Status: DC

## 2023-07-28 MED ORDER — ACETAMINOPHEN 500 MG PO TABS: 500.0000 mg | ORAL_TABLET | Freq: Four times a day (QID) | ORAL | Status: DC | PRN

## 2023-07-28 NOTE — TOC Transition Note (Signed)
Transition of Care [facility]) - Discharge Note   Patient Details  Name: Chad Gordon MRN: [id number] Date of Birth: 27-Nov-1945  [facility]) CM/SW Contact:  Ronny Bacon, RN Phone Number: 07/28/2023, 8:08 AM   Clinical Narrative:   Patient is being discharged today. Heather with [facility] made aware.    Final next level of care: Home w Home Health Services Barriers to Discharge: No Barriers Identified   Patient Goals and CMS Choice Patient states their goals for this hospitalization and ongoing recovery are:: to return home once stable.          Discharge Placement                       Discharge Plan and Services Additional resources added to the After Visit Summary for     Discharge Planning Services: CM Consult Post Acute Care Choice: Home Health                               Social Drivers of Health (SDOH) Interventions SDOH Screenings   Food Insecurity: No Food Insecurity (07/17/2023)  Housing: Low Risk  (07/18/2023)  Transportation Needs: No Transportation Needs (07/17/2023)  Utilities: Not At Risk (07/17/2023)  Tobacco Use: Medium Risk (07/24/2023)     Readmission Risk Interventions     No data to display

## 2023-07-28 NOTE — Progress Notes (Addendum)
      [address]       [place] [address]             [phone]      4 Days Post-Op Procedure(s) (LRB): CORONARY ARTERY BYPASS GRAFTING (CABG) x TWO, USING LEFT INTERNAL MAMMARY ARTERY & RIGHT LEG GREATER SAPHENOUS VEIN HARVESTED ENDOSCOPICALLY (N/A) TRANSESOPHAGEAL ECHOCARDIOGRAM (TEE) (N/A)  Subjective:  Patient doing well.  Has no complaints.  Feels ready to go home today.  Objective: Vital signs in last 24 hours: Temp:  [97.6 F (36.4 C)-98.7 F (37.1 C)] 97.6 F (36.4 C) (12/22 0320) Pulse Rate:  [78-92] 92 (12/22 0320) Cardiac Rhythm: Normal sinus rhythm (12/21 1901) Resp:  [14-20] 19 (12/22 0320) BP: (134-153)/(73-90) 153/78 (12/22 0320) SpO2:  [96 %-100 %] 99 % (12/22 0320) Weight:  [99.3 kg] 99.3 kg (12/22 0320)  Intake/Output from previous day: 12/21 0701 - 12/22 0700 In: 360 [P.O.:360] Out: 900 [Urine:900]  General appearance: alert, cooperative, and no distress Heart: regular rate and rhythm Lungs: clear to auscultation bilaterally Abdomen: soft, non-tender; bowel sounds normal; no masses,  no organomegaly Extremities: edema trace Wound: clean and dry  Lab Results: Recent Labs    07/25/23 1514 07/26/23 0530  WBC 16.7* 13.3*  HGB 14.0 13.0  HCT 42.0 39.1  PLT 308 267   BMET:  Recent Labs    07/26/23 0530 07/28/23 0305  NA 134* 134*  K 4.0 3.9  CL 101 102  CO2 26 21*  GLUCOSE 97 94  BUN 22 16  CREATININE 1.34* 0.86  CALCIUM 8.8* 8.6*    PT/INR: No results for input(s): "LABPROT", "INR" in the last 72 hours. ABG    Component Value Date/Time   PHART 7.299 (L) 07/24/2023 1846   HCO3 22.3 07/24/2023 1846   TCO2 24 07/24/2023 1846   ACIDBASEDEF 4.0 (H) 07/24/2023 1846   O2SAT 94 07/24/2023 1846   CBG (last 3)  No results for input(s): "GLUCAP" in the last 72 hours.  Assessment/Plan: S/P Procedure(s) (LRB): CORONARY ARTERY BYPASS GRAFTING (CABG) x TWO, USING LEFT INTERNAL MAMMARY ARTERY & RIGHT LEG GREATER  SAPHENOUS VEIN HARVESTED ENDOSCOPICALLY (N/A) TRANSESOPHAGEAL ECHOCARDIOGRAM (TEE) (N/A)  CV- NSR, BP elevated- continue Lopressor, resume home Benicar Pulm- no acute issues, off oxygen continue IS Renal- creatinine back to normal at 0.86, no lasix, potassium indicated at this time Deconditioning- home PT arranged Dispo- patient stable, for d/c home today   LOS: 11 days    Lowella Dandy, PA-C 07/28/2023 Patient seen and examined, agree with above Home today  Anisa Leanos C. Dorris Fetch, MD [facility] [phone]

## 2023-07-28 NOTE — Progress Notes (Signed)
Pt ambulated x 400 feet with front wheel walker, pt tolerated well

## 2023-07-29 DIAGNOSIS — Z48812 Encounter for surgical aftercare following surgery on the circulatory system: Secondary | ICD-10-CM | POA: Diagnosis not present

## 2023-08-02 ENCOUNTER — Telehealth: Payer: Self-pay

## 2023-08-02 NOTE — Telephone Encounter (Signed)
Patient contacted the office stating he has had constipation since Monday. States this was his last BM. States that he does have sennakot at home requesting to use it. Advised this is ok to take, he acknowledged receipt. Patient is s/p CABG with Dr. Cliffton Asters 12/18.

## 2023-08-06 ENCOUNTER — Telehealth (HOSPITAL_COMMUNITY): Payer: Self-pay

## 2023-08-06 NOTE — Telephone Encounter (Signed)
 Phase II referral for Cardiac Rehab faxed to [facility].

## 2023-08-08 MED FILL — Sodium Chloride IV Soln 0.9%: INTRAVENOUS | Qty: 1000 | Status: AC

## 2023-08-08 MED FILL — Electrolyte-R (PH 7.4) Solution: INTRAVENOUS | Qty: 4000 | Status: AC

## 2023-08-08 MED FILL — Calcium Chloride Inj 10%: INTRAVENOUS | Qty: 10 | Status: AC

## 2023-08-08 MED FILL — Mannitol IV Soln 20%: INTRAVENOUS | Qty: 500 | Status: AC

## 2023-08-08 MED FILL — Sodium Bicarbonate IV Soln 8.4%: INTRAVENOUS | Qty: 50 | Status: AC

## 2023-08-08 MED FILL — Heparin Sodium (Porcine) Inj 1000 Unit/ML: INTRAMUSCULAR | Qty: 10 | Status: AC

## 2023-08-08 NOTE — Progress Notes (Addendum)
 Cardiology Office Note:  .   Date:  08/09/2023  ID:  [name], DOB 1946-05-29, MRN [id number] PCP: Ofilia Lamar CROME, MD  [facility] Providers Cardiologist:  Alean SAUNDERS Madireddy, MD    History of Present Illness: .   Emmanual Gauthreaux is a 78 y.o. male with a past medical history of CAD s/p CABG 2024, hypertension, GERD, BPH, mild bilateral carotid artery stenosis.  07/26/2023 intraoperative echo EF 55 to 60%, mildly increased LV wall thickening with mild concentric hypertrophy 07/26/2023 CABG with LIMA to LAD, SVG to OM1 07/20/2023 carotid duplex mild bilateral carotid artery stenosis 07/17/2023 left heart cath severe distal left main ostial LAD 70% stenosis involving hide D1/R1 and small caliber left circumflex >> CVTS consultation  He established care with Dr. Liborio on 06/28/2023 the best of his PCP for evaluation of chest pain that have been progressive in nature over the past 6 to 7 months.  He underwent a coronary CTA which revealed a calcium  score of 428, 54th percentile, severe stenosis in his left main.  Subsequent left heart cath on 07/17/2023 revealed severe distal left main disease >> referred to CVTS >> CABG x 2 LIMA to LAD and SVG to OM1 on 07/24/2023 and was ultimately discharged home on postop day 5.  He presents today for follow-up after recent CABG as outlined above.  He states he is not feeling well, feels short of breath,  as bad as I felt before had surgery.  He states when he first got the hospital he felt better than he does right now.  He looks remarkably well.  He is not requiring any pain medication.  Sternotomy site is healing well.  He is adhering to sternal precautions.  He is sleeping in a recliner, does not feel quite ready to sleep in the bed yet.  He denies chest pain, palpitations, pnd, orthopnea, n, v, dizziness, syncope, edema, weight gain, or early satiety.   ROS: Review of Systems  Respiratory:  Positive for shortness of breath.    All other systems reviewed and are negative.    Studies Reviewed: .        Cardiac Studies & Procedures   CARDIAC CATHETERIZATION  CARDIAC CATHETERIZATION 07/17/2023  Narrative   Mid LM to Prox LAD lesion is 70% stenosed.   Ost Cx lesion is 45% stenosed.   The left ventricular systolic function is normal.   LV end diastolic pressure is normal.   The left ventricular ejection fraction is 55-65% by visual estimate.   There is no aortic valve stenosis.  Severe distal Left Main-ostial LAD 70% stenosis involving high-D1/RI and small caliber LCx Apparently preserved LVEF by LV gram, but poor imaging-will order 2D Echo. Normal LVEDP   RECOMMENDATIONS   Based on severe Left Main disease, the patient will be admitted to telemetry floor for CVTS consultation. Will initiate IV NTG for BP control and antianginal benefit. Will hold off on starting IV heparin  unless he were to have anginal symptoms (throat pain) Titrate statin to rosuvastatin  40 mg daily Continue ARB and nebivolol  along with aspirin . Check 2D Echo Recommend Aspirin  81mg  daily for moderate CAD. Preop workup per CVTS  Anticipated discharge date to be determined based on OR schedule    Alm Clay, MD  Findings Coronary Findings Diagnostic  Dominance: Right  Left Main Vessel was injected. Vessel is large. There is severe focal disease in the vessel. Mid LM to Prox LAD lesion is 70% stenosed.  Left Anterior Descending Vessel  is large.  First Diagonal Branch Vessel is Large - courses as Ramus  Third Diagonal Branch Vessel is small in size.  Left Circumflex Vessel is small. Ost Cx lesion is 45% stenosed.  First Obtuse Marginal Branch Vessel is small in size.  Second Obtuse Marginal Branch Vessel is small in size.  Right Coronary Artery Vessel was injected. Vessel is large. Vessel is angiographically normal. The vessel is mildly tortuous.  Acute Marginal Branch Vessel is small in size.  Right  Ventricular Branch Vessel is small in size.  Intervention  No interventions have been documented.    ECHOCARDIOGRAM  ECHOCARDIOGRAM COMPLETE 07/17/2023  Narrative ECHOCARDIOGRAM REPORT    Patient Name:   KATLIN CISZEWSKI Date of Exam: 07/17/2023 Medical Rec #:  [id number]      Height:       75.0 in Accession #:    [id number]     Weight:       226.0 lb Date of Birth:  1945/10/05     BSA:          2.311 m Patient Age:    74 years       BP:           142/66 mmHg Patient Gender: M              HR:           81 bpm. Exam Location:  Inpatient  Procedure: 2D Echo and Color Doppler  Indications:    I25.110 Atherosclerotic heart disease of native coronary artery with unstable angina pectoris  History:        Patient has prior history of Echocardiogram examinations, most recent 12/11/2022.  Sonographer:    Jayson Gaskins Referring Phys: [id number] HARRELL O LIGHTFOOT  IMPRESSIONS   1. Left ventricular ejection fraction, by estimation, is 60 to 65%. The left ventricle has normal function. The left ventricle has no regional wall motion abnormalities. Left ventricular diastolic parameters were normal. 2. Right ventricular systolic function is normal. The right ventricular size is normal. 3. The mitral valve is grossly normal. Trivial mitral valve regurgitation. No evidence of mitral stenosis. 4. The aortic valve was not well visualized. Aortic valve regurgitation is not visualized. Aortic valve sclerosis is present, with no evidence of aortic valve stenosis. 5. The inferior vena cava is normal in size with greater than 50% respiratory variability, suggesting right atrial pressure of 3 mmHg.  Comparison(s): Prior images unable to be directly viewed, comparison made by report only.  Conclusion(s)/Recommendation(s): Normal biventricular function without evidence of hemodynamically significant valvular heart disease.  FINDINGS Left Ventricle: Left ventricular ejection fraction, by estimation,  is 60 to 65%. The left ventricle has normal function. The left ventricle has no regional wall motion abnormalities. Definity  contrast agent was given IV to delineate the left ventricular endocardial borders. The left ventricular internal cavity size was normal in size. There is no left ventricular hypertrophy. Left ventricular diastolic parameters were normal.  Right Ventricle: The right ventricular size is normal. Right vetricular wall thickness was not well visualized. Right ventricular systolic function is normal.  Left Atrium: Left atrial size was normal in size.  Right Atrium: Right atrial size was normal in size.  Pericardium: There is no evidence of pericardial effusion.  Mitral Valve: The mitral valve is grossly normal. Mild mitral annular calcification. Trivial mitral valve regurgitation. No evidence of mitral valve stenosis.  Tricuspid Valve: The tricuspid valve is not well visualized. Tricuspid valve regurgitation is trivial. No evidence of tricuspid stenosis.  Aortic Valve: The aortic valve was not well visualized. Aortic valve regurgitation is not visualized. Aortic valve sclerosis is present, with no evidence of aortic valve stenosis. Aortic valve mean gradient measures 4.0 mmHg. Aortic valve peak gradient measures 6.7 mmHg. Aortic valve area, by VTI measures 2.92 cm.  Pulmonic Valve: The pulmonic valve was not well visualized. Pulmonic valve regurgitation is not visualized.  Aorta: The aortic root is normal in size and structure and the ascending aorta was not well visualized.  Venous: The inferior vena cava is normal in size with greater than 50% respiratory variability, suggesting right atrial pressure of 3 mmHg.  IAS/Shunts: The interatrial septum was not well visualized.   LEFT VENTRICLE PLAX 2D LVIDd:         4.20 cm   Diastology LVIDs:         2.80 cm   LV e' medial:    6.64 cm/s LV PW:         1.00 cm   LV E/e' medial:  10.9 LV IVS:        1.20 cm   LV e'  lateral:   6.53 cm/s LVOT diam:     2.10 cm   LV E/e' lateral: 11.0 LV SV:         71 LV SV Index:   31 LVOT Area:     3.46 cm   RIGHT VENTRICLE RV S prime:     18.90 cm/s TAPSE (M-mode): 3.1 cm  LEFT ATRIUM             Index        RIGHT ATRIUM           Index LA Vol (A2C):   38.5 ml 16.66 ml/m  RA Area:     10.20 cm LA Vol (A4C):   27.5 ml 11.90 ml/m  RA Volume:   20.40 ml  8.83 ml/m LA Biplane Vol: 36.0 ml 15.58 ml/m AORTIC VALVE AV Area (Vmax):    3.44 cm AV Area (Vmean):   2.73 cm AV Area (VTI):     2.92 cm AV Vmax:           129.00 cm/s AV Vmean:          99.900 cm/s AV VTI:            0.242 m AV Peak Grad:      6.7 mmHg AV Mean Grad:      4.0 mmHg LVOT Vmax:         128.00 cm/s LVOT Vmean:        78.700 cm/s LVOT VTI:          0.204 m LVOT/AV VTI ratio: 0.84  AORTA Ao Root diam: 3.10 cm  MITRAL VALVE MV Area (PHT): 3.08 cm     SHUNTS MV Decel Time: 246 msec     Systemic VTI:  0.20 m MV E velocity: 72.10 cm/s   Systemic Diam: 2.10 cm MV A velocity: 118.00 cm/s MV E/A ratio:  0.61  Shelda Bruckner MD Electronically signed by Shelda Bruckner MD Signature Date/Time: 07/17/2023/7:39:09 PM    Final  TEE  ECHO INTRAOPERATIVE TEE 07/24/2023  Narrative *INTRAOPERATIVE TRANSESOPHAGEAL REPORT *    Patient Name:   JARRYD GRATZ Date of Exam: 07/24/2023 Medical Rec #:  [id number]      Height:       75.0 in Accession #:    [id number]     Weight:       226.0 lb Date of  Birth:  Dec 25, 1945     BSA:          2.31 m Patient Age:    77 years       BP:           138/70 mmHg Patient Gender: M              HR:           65 bpm. Exam Location:  Anesthesiology  Transesophogeal exam was perform intraoperatively during surgical procedure. Patient was closely monitored under general anesthesia during the entirety of examination.  Indications:     I25.790 Atherosclerosis of other coronary artery bypass graft(s) with unstable angina pectoris;  I25.110 Atherosclerotic heart disease of native coronary artery with unstable angina pectoris Sonographer:     Tinnie Barefoot RDCS Performing Phys: Bernardino Mango MD Diagnosing Phys: Bernardino Mango MD  Complications: No known complications during this procedure. POST-OP IMPRESSIONS _ Left Ventricle: The left ventricle is unchanged from pre-bypass. _ Right Ventricle: The right ventricle appears unchanged from pre-bypass. _ Aorta: The aorta appears unchanged from pre-bypass. _ Left Atrial Appendage: The left atrial appendage appears unchanged from pre-bypass. _ Aortic Valve: The aortic valve appears unchanged from pre-bypass. _ Mitral Valve: There is trivial regurgitation. _ Tricuspid Valve: There is trivial regurgitation. _ Pulmonic Valve: The pulmonic valve appears unchanged from pre-bypass. _ Interatrial Septum: The interatrial septum appears unchanged from pre-bypass. _ Pericardium: The pericardium appears unchanged from pre-bypass.  PRE-OP FINDINGS Left Ventricle: The left ventricle has normal systolic function, with an ejection fraction of 55-60%. The cavity size was normal. There is mildly increased left ventricular wall thickness. There is mild concentric left ventricular hypertrophy.  Right Ventricle: The right ventricle has normal systolic function. The cavity was normal. There is no increase in right ventricular wall thickness.  Left Atrium: Left atrial size was normal in size. No left atrial/left atrial appendage thrombus was detected.  Right Atrium: Right atrial size was normal in size.  Interatrial Septum: No atrial level shunt detected by color flow Doppler.  Pericardium: There is no evidence of pericardial effusion.  Mitral Valve: The mitral valve is normal in structure. Mitral valve regurgitation is not visualized by color flow Doppler.  Tricuspid Valve: The tricuspid valve was normal in structure. Tricuspid valve regurgitation was not visualized by color flow  Doppler.  Aortic Valve: The aortic valve is normal in structure. Aortic valve regurgitation was not visualized by color flow Doppler. There is no stenosis of the aortic valve.  Pulmonic Valve: The pulmonic valve was normal in structure. Pulmonic valve regurgitation is not visualized by color flow Doppler.   Aorta: The aortic root, ascending aorta and aortic arch are normal in size and structure. There is evidence of plaque in the descending aorta.  +--------------+-------++ LEFT VENTRICLE        +--------------+-------++ PLAX 2D               +--------------+-------++ LVIDd:        4.50 cm +--------------+-------++ LVIDs:        3.10 cm +--------------+-------++ LV SV:        55 ml   +--------------+-------++ LV SV Index:  23.25   +--------------+-------++                       +--------------+-------++  +-------------+-----------++ AORTIC VALVE             +-------------+-----------++ AV Vmax:     133.00 cm/s +-------------+-----------++ AV Vmean:  86.100 cm/s +-------------+-----------++ AV VTI:      0.272 m     +-------------+-----------++ AV Peak Grad:7.1 mmHg    +-------------+-----------++ AV Mean Grad:3.0 mmHg    +-------------+-----------++   Bernardino Mango MD Electronically signed by Bernardino Mango MD Signature Date/Time: 07/26/2023/4:23:35 PM    Final            Risk Assessment/Calculations:          Physical Exam:   VS:  BP (!) 146/84 (BP Location: Right Arm, Patient Position: Sitting, Cuff Size: Normal)   Pulse 75   Ht 6' 3 (1.905 m)   Wt 205 lb (93 kg)   BMI 25.62 kg/m    Wt Readings from Last 3 Encounters:  08/09/23 205 lb (93 kg)  07/28/23 218 lb 14.4 oz (99.3 kg)  07/10/23 226 lb 9.6 oz (102.8 kg)    GEN: Well nourished, well developed in no acute distress NECK: No JVD; No carotid bruits CARDIAC: RRR, no murmurs, rubs, gallops RESPIRATORY: Diminished lung sounds on the right as  well as the left ABDOMEN: Soft, non-tender, non-distended EXTREMITIES:  No edema; No deformity   ASSESSMENT AND PLAN: .   CAD-s/p CABG x 2, sternotomy site is healing well--no residual sutures, he is adhering to sternal precautions.  Using his incentive spirometer but only a couple times each day-encouraged him to use it approximately 10 times every hour while he is awake-does she states he can inhale up to 1750.  Will arrange for stat chest x-ray at [facility] for worsening SOB, I do not appreciate any rales and he does not appear to be volume overloaded but his LS are slightly diminished.  Continue aspirin  81 mg daily, continue metoprolol  25 mg twice daily.  Hypertension-blood pressure is well-controlled at 128/88, continue olmesartan  40 mg daily.  Dyslipidemia-most recent LDL was well-controlled at 47, continue Crestor  40 mg daily.  ** addendum - CXR at [facility] on 08/09/23 revealed  FINDINGS:  Intact sternotomy wires.  No pulmonary parenchymal consolidative opacities.  No pneumothorax or significant pleural effusion. Elevation of the left hemidiaphragm.        Dispo: Stat chest x-ray, CBC, BMET, proBNP. Follow up in 6 weeks with Dr. Liborio.   Signed, Delon JAYSON Hoover, NP

## 2023-08-09 ENCOUNTER — Telehealth: Payer: Self-pay | Admitting: Cardiology

## 2023-08-09 ENCOUNTER — Encounter: Payer: Self-pay | Admitting: Cardiology

## 2023-08-09 ENCOUNTER — Ambulatory Visit: Payer: Medicare Other | Attending: Cardiology | Admitting: Cardiology

## 2023-08-09 ENCOUNTER — Telehealth: Payer: Self-pay

## 2023-08-09 ENCOUNTER — Ambulatory Visit (INDEPENDENT_AMBULATORY_CARE_PROVIDER_SITE_OTHER): Payer: Self-pay | Admitting: Thoracic Surgery (Cardiothoracic Vascular Surgery)

## 2023-08-09 VITALS — BP 146/84 | HR 75 | Ht 75.0 in | Wt 205.0 lb

## 2023-08-09 DIAGNOSIS — Z951 Presence of aortocoronary bypass graft: Secondary | ICD-10-CM

## 2023-08-09 DIAGNOSIS — I10 Essential (primary) hypertension: Secondary | ICD-10-CM | POA: Diagnosis not present

## 2023-08-09 DIAGNOSIS — R079 Chest pain, unspecified: Secondary | ICD-10-CM | POA: Diagnosis not present

## 2023-08-09 DIAGNOSIS — I25118 Atherosclerotic heart disease of native coronary artery with other forms of angina pectoris: Secondary | ICD-10-CM | POA: Diagnosis not present

## 2023-08-09 DIAGNOSIS — R0602 Shortness of breath: Secondary | ICD-10-CM

## 2023-08-09 DIAGNOSIS — R0609 Other forms of dyspnea: Secondary | ICD-10-CM

## 2023-08-09 NOTE — Patient Instructions (Addendum)
 Medication Instructions:  Your physician recommends that you continue on your current medications as directed. Please refer to the Current Medication list given to you today.  *If you need a refill on your cardiac medications before your next appointment, please call your pharmacy*   Lab Work: Your physician recommends that you have labs done in the office today. Your test included  complete  metabolic panel, complete blood count, and Pro BNP  If you have labs (blood work) drawn today and your tests are completely normal, you will receive your results only by: MyChart Message (if you have MyChart) OR A paper copy in the mail If you have any lab test that is abnormal or we need to change your treatment, we will call you to review the results.   Testing/Procedures: A chest x-ray takes a picture of the organs and structures inside the chest, including the heart, lungs, and blood vessels. This test can show several things, including, whether the heart is enlarges; whether fluid is building up in the lungs; and whether pacemaker / defibrillator leads are still in place.    Follow-Up: At [facility], you and your health needs are our priority.  As part of our continuing mission to provide you with exceptional heart care, we have created designated Provider Care Teams.  These Care Teams include your primary Cardiologist (physician) and Advanced Practice Providers (APPs -  Physician Assistants and Nurse Practitioners) who all work together to provide you with the care you need, when you need it.  We recommend signing up for the patient portal called MyChart.  Sign up information is provided on this After Visit Summary.  MyChart is used to connect with patients for Virtual Visits (Telemedicine).  Patients are able to view lab/test results, encounter notes, upcoming appointments, etc.  Non-urgent messages can be sent to your provider as well.   To learn more about what you can do with MyChart, go to  [email or web address].    Your next appointment:   6 weeks with Dr. Liborio

## 2023-08-09 NOTE — Progress Notes (Signed)
     [address]       [place] [address]             [phone]       Patient: Home Provider: Office Consent for Telemedicine visit obtained.  Today's visit was completed via a real-time telehealth (see specific modality noted below). The patient/authorized person provided oral consent at the time of the visit to engage in a telemedicine encounter with the present provider at [facility]. The patient/authorized person was informed of the potential benefits, limitations, and risks of telemedicine. The patient/authorized person expressed understanding that the laws that protect confidentiality also apply to telemedicine. The patient/authorized person acknowledged understanding that telemedicine does not provide emergency services and that he or she would need to call 911 or proceed to the nearest hospital for help if such a need arose.   Total time spent in the clinical discussion 10 minutes.  Telehealth Modality: Phone visit (audio only)  I had a telephone visit with  Chad Gordon who is s/p CABG.  Overall doing well.  Pain is minimal.  Ambulating well. Vitals have been stable.  Chad Gordon will see us  back in 1 month with a chest x-ray for cardiac rehab clearance.  Chad Gordon

## 2023-08-09 NOTE — Telephone Encounter (Signed)
 Caller Thayer Ohm) reported patient's STAT results were posted to patient's chart.

## 2023-08-10 LAB — PRO B NATRIURETIC PEPTIDE: NT-Pro BNP: 581 pg/mL — ABNORMAL HIGH (ref 0–486)

## 2023-08-10 LAB — COMPREHENSIVE METABOLIC PANEL WITH GFR
ALT: 45 IU/L — ABNORMAL HIGH (ref 0–44)
AST: 30 IU/L (ref 0–40)
Albumin: 4.2 g/dL (ref 3.8–4.8)
Alkaline Phosphatase: 130 IU/L — ABNORMAL HIGH (ref 44–121)
BUN/Creatinine Ratio: 12 (ref 10–24)
BUN: 12 mg/dL (ref 8–27)
Bilirubin Total: 0.6 mg/dL (ref 0.0–1.2)
CO2: 22 mmol/L (ref 20–29)
Calcium: 9.8 mg/dL (ref 8.6–10.2)
Chloride: 101 mmol/L (ref 96–106)
Creatinine, Ser: 1.04 mg/dL (ref 0.76–1.27)
Globulin, Total: 2.4 g/dL (ref 1.5–4.5)
Glucose: 104 mg/dL — ABNORMAL HIGH (ref 70–99)
Potassium: 4.8 mmol/L (ref 3.5–5.2)
Sodium: 141 mmol/L (ref 134–144)
Total Protein: 6.6 g/dL (ref 6.0–8.5)
eGFR: 73 mL/min/1.73

## 2023-08-10 LAB — CBC
Hematocrit: 44.1 % (ref 37.5–51.0)
Hemoglobin: 14.2 g/dL (ref 13.0–17.7)
MCH: 28.5 pg (ref 26.6–33.0)
MCHC: 32.2 g/dL (ref 31.5–35.7)
MCV: 89 fL (ref 79–97)
Platelets: 638 x10E3/uL — ABNORMAL HIGH (ref 150–450)
RBC: 4.98 x10E6/uL (ref 4.14–5.80)
RDW: 13.1 % (ref 11.6–15.4)
WBC: 12.7 x10E3/uL — ABNORMAL HIGH (ref 3.4–10.8)

## 2023-09-10 ENCOUNTER — Other Ambulatory Visit: Payer: Self-pay | Admitting: Thoracic Surgery (Cardiothoracic Vascular Surgery)

## 2023-09-10 DIAGNOSIS — Z951 Presence of aortocoronary bypass graft: Secondary | ICD-10-CM

## 2023-09-12 ENCOUNTER — Ambulatory Visit
Admission: RE | Admit: 2023-09-12 | Discharge: 2023-09-12 | Disposition: A | Discharge status: Home / Self Care | Payer: Medicare Other | Source: Ambulatory Visit | Attending: Thoracic Surgery (Cardiothoracic Vascular Surgery) | Admitting: Thoracic Surgery (Cardiothoracic Vascular Surgery)

## 2023-09-12 ENCOUNTER — Ambulatory Visit (INDEPENDENT_AMBULATORY_CARE_PROVIDER_SITE_OTHER): Payer: Self-pay | Admitting: Physician Assistant

## 2023-09-12 VITALS — BP 136/80 | HR 113 | Resp 20 | Ht 75.0 in | Wt 205.6 lb

## 2023-09-12 DIAGNOSIS — Z951 Presence of aortocoronary bypass graft: Secondary | ICD-10-CM

## 2023-09-12 NOTE — Progress Notes (Signed)
  HPI:  Patient returns for routine postoperative follow-up having undergone CABG x 2 by Dr. Shyrl about 2 months ago The patient's early postoperative recovery while in the hospital was entirely uneventful.  Since hospital discharge, he has had a virtual follow-up visit with Dr. Shyrl and has also been seen by the cardiology team.  Mr. Fester has started participating in cardiac rehab.  He feels he is progressing overall and is anxious to increase his activity level.  He is not requiring any pain medication.   Current Outpatient Medications  Medication Sig Dispense Refill   acetaminophen  (TYLENOL ) 500 MG tablet Take 1-2 tablets (500-1,000 mg total) by mouth every 6 (six) hours as needed.     aspirin  EC 81 MG tablet Take 1 tablet (81 mg total) by mouth daily. Swallow whole. 90 tablet 3   metoprolol  tartrate (LOPRESSOR ) 25 MG tablet Take 1 tablet (25 mg total) by mouth 2 (two) times daily. 60 tablet 3   olmesartan  (BENICAR ) 40 MG tablet Take 40 mg by mouth daily.     omeprazole  (PRILOSEC) 20 MG capsule Take 1 capsule (20 mg total) by mouth daily. 90 capsule 3   rosuvastatin  (CRESTOR ) 40 MG tablet Take 1 tablet (40 mg total) by mouth daily. 30 tablet 3   No current facility-administered medications for this visit.    Physical Exam  Vital signs BP 136/80 Heart rate 113 Respirations 20 SpO2 98% on room air  General: Energetic 78 year old male in no distress.  He is accompanied by his son today. Heart: Regular rhythm, tachycardic Chest: Sternotomy incision is well-healed and the sternum is stable.  Breath sounds are full, equal, and clear to auscultation. Extremities: No peripheral edema, the right thigh EVH incision is well-healed.  Diagnostic Tests: Narrative & Impression  CLINICAL DATA:  Postop CABG 7 weeks ago.   EXAM: CHEST - 2 VIEW   COMPARISON:  Radiographs 08/09/2023 and 07/26/2023.   FINDINGS: The heart size and mediastinal contours are stable status post recent  median sternotomy and CABG. There is chronic elevation of the left hemidiaphragm with associated chronic left basilar atelectasis or scarring. The right lung is clear. No pleural effusion or pneumothorax. Intact median sternotomy. Mild degenerative changes in the spine.   IMPRESSION: Stable chest status post recent CABG. No evidence of acute cardiopulmonary process. Chronic elevation of the left hemidiaphragm with associated chronic left basilar atelectasis or scarring.     Electronically Signed   By: Elsie Perone M.D.   On: 09/12/2023 15:40      Impression / Plan:  Mr. Venus is making a progressive and satisfactory recovery following CABG x 2.  He has mild tachycardia that he says is stable based on vital sign measurements during cardiac rehab sessions.  His systolic blood pressure during exercise ranges from 110-130 so we will plan to continue with the metoprolol  dosing at 25 mg twice daily for now.  Other medications were reviewed and require no changes from CT surgery standpoint.  He is already participating in cardiac rehab.  He should continue to observe sternal precautions for another 4 weeks after which he may advance his activity without limitation. Mr. Milewski has scheduled follow-up with cardiology in about a week.  He may follow-up with us  as needed.   Sabree Nuon G. Coretta Leisey, PA-C [facility] [phone]

## 2023-09-12 NOTE — Patient Instructions (Signed)
 No change medications from CT surgery standpoint  Continue to observe sternal precautions for another 4 weeks.  After that, he may advance activity without limitation  Follow-up with cardiology as scheduled.  Follow-up with CT surgery as needed.

## 2023-09-17 DIAGNOSIS — B9789 Other viral agents as the cause of diseases classified elsewhere: Secondary | ICD-10-CM

## 2023-09-17 HISTORY — DX: Other viral agents as the cause of diseases classified elsewhere: B97.89

## 2023-09-18 DIAGNOSIS — M2540 Effusion, unspecified joint: Secondary | ICD-10-CM | POA: Insufficient documentation

## 2023-09-18 DIAGNOSIS — M1990 Unspecified osteoarthritis, unspecified site: Secondary | ICD-10-CM | POA: Insufficient documentation

## 2023-09-18 DIAGNOSIS — M2550 Pain in unspecified joint: Secondary | ICD-10-CM | POA: Insufficient documentation

## 2023-09-18 DIAGNOSIS — G43109 Migraine with aura, not intractable, without status migrainosus: Secondary | ICD-10-CM | POA: Insufficient documentation

## 2023-09-20 ENCOUNTER — Ambulatory Visit: Payer: Medicare Other

## 2023-09-20 VITALS — BP 122/84 | HR 82 | Ht 75.0 in | Wt 202.0 lb

## 2023-09-20 DIAGNOSIS — G43109 Migraine with aura, not intractable, without status migrainosus: Secondary | ICD-10-CM

## 2023-09-20 DIAGNOSIS — M2540 Effusion, unspecified joint: Secondary | ICD-10-CM

## 2023-09-20 DIAGNOSIS — M1990 Unspecified osteoarthritis, unspecified site: Secondary | ICD-10-CM

## 2023-09-23 NOTE — Progress Notes (Signed)
 Patient left without being seen.

## 2023-10-07 ENCOUNTER — Ambulatory Visit: Payer: Medicare Other

## 2023-10-07 VITALS — BP 126/84 | Ht 75.0 in | Wt 208.8 lb

## 2023-10-07 DIAGNOSIS — I2510 Atherosclerotic heart disease of native coronary artery without angina pectoris: Secondary | ICD-10-CM

## 2023-10-07 DIAGNOSIS — E782 Mixed hyperlipidemia: Secondary | ICD-10-CM | POA: Diagnosis not present

## 2023-10-07 DIAGNOSIS — I10 Essential (primary) hypertension: Secondary | ICD-10-CM | POA: Diagnosis not present
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
## 2023-10-07 NOTE — Assessment & Plan Note (Signed)
 Continue with rosuvastatin 40 mg once daily. Last lipid panel 07/21/2023 total cholesterol 103, HDL 41, LDL 47, triglycerides 73.  Good control.

## 2023-10-07 NOTE — Assessment & Plan Note (Signed)
 Under good control. Target below 130 over 80 mmHg. Continue metoprolol tartrate 25 mg twice daily and olmesartan 40 mg once a day.

## 2023-10-07 NOTE — Assessment & Plan Note (Signed)
 Doing well good functional status. Continue with cardiac rehab as scheduled.  Continue with aspirin 81 mg once daily and rosuvastatin 40 mg once daily. Continue metoprolol tartrate 25 mg twice daily

## 2023-10-07 NOTE — Patient Instructions (Signed)
 Medication Instructions:  Your physician recommends that you continue on your current medications as directed. Please refer to the Current Medication list given to you today.  *If you need a refill on your cardiac medications before your next appointment, please call your pharmacy*   Lab Work: None Ordered If you have labs (blood work) drawn today and your tests are completely normal, you will receive your results only by: MyChart Message (if you have MyChart) OR A paper copy in the mail If you have any lab test that is abnormal or we need to change your treatment, we will call you to review the results.   Testing/Procedures: None Ordered   Follow-Up: At [facility], you and your health needs are our priority.  As part of our continuing mission to provide you with exceptional heart care, we have created designated Provider Care Teams.  These Care Teams include your primary Cardiologist (physician) and Advanced Practice Providers (APPs -  Physician Assistants and Nurse Practitioners) who all work together to provide you with the care you need, when you need it.  We recommend signing up for the patient portal called "MyChart".  Sign up information is provided on this After Visit Summary.  MyChart is used to connect with patients for Virtual Visits (Telemedicine).  Patients are able to view lab/test results, encounter notes, upcoming appointments, etc.  Non-urgent messages can be sent to your provider as well.   To learn more about what you can do with MyChart, go to [email or web address].    Your next appointment:   6 month Follow up

## 2023-10-21 DIAGNOSIS — J439 Emphysema, unspecified: Secondary | ICD-10-CM

## 2023-10-21 HISTORY — DX: Emphysema, unspecified: J43.9

## 2023-11-22 NOTE — Telephone Encounter (Signed)
 Patient made aware of recommendations. Will close encounter.

## 2023-11-25 ENCOUNTER — Other Ambulatory Visit: Payer: Self-pay | Admitting: Physician Assistant

## 2023-11-27 ENCOUNTER — Telehealth: Payer: Self-pay

## 2023-11-27 MED ORDER — METOPROLOL TARTRATE 25 MG PO TABS: 25.0000 mg | ORAL_TABLET | Freq: Two times a day (BID) | ORAL | 3 refills | Status: DC

## 2023-11-27 MED ORDER — ROSUVASTATIN CALCIUM 40 MG PO TABS: 40.0000 mg | ORAL_TABLET | Freq: Every day | ORAL | 3 refills | Status: AC

## 2023-11-27 NOTE — Telephone Encounter (Signed)
 RX sent

## 2023-11-27 NOTE — Telephone Encounter (Signed)
*  STAT* If patient is at the pharmacy, call can be transferred to refill team.   1. Which medications need to be refilled? (please list name of each medication and dose if known)   metoprolol  tartrate (LOPRESSOR ) 25 MG tablet  rosuvastatin  (CRESTOR ) 40 MG tablet   2. Would you like to learn more about the convenience, safety, & potential cost savings by using the [facility]?   3. Are you open to using the [facility] (Type [facility]. ).  4. Which pharmacy/location (including street and city if local pharmacy) is medication to be sent to?  [facility] - [place], [place] - [address]   5. Do they need a 30 day or 90 day supply?   90 day  Patient stated he has a couple of tablets left.

## 2024-01-03 DIAGNOSIS — T84296A Other mechanical complication of internal fixation device of vertebrae, initial encounter: Secondary | ICD-10-CM

## 2024-01-03 HISTORY — DX: Other mechanical complication of internal fixation device of vertebrae, initial encounter: T84.296A

## 2024-01-20 DIAGNOSIS — R942 Abnormal results of pulmonary function studies: Secondary | ICD-10-CM

## 2024-01-20 DIAGNOSIS — J4530 Mild persistent asthma, uncomplicated: Secondary | ICD-10-CM

## 2024-01-20 HISTORY — DX: Abnormal results of pulmonary function studies: R94.2

## 2024-01-20 HISTORY — DX: Mild persistent asthma, uncomplicated: J45.30

## 2024-04-29 DIAGNOSIS — M960 Pseudarthrosis after fusion or arthrodesis: Secondary | ICD-10-CM

## 2024-04-29 HISTORY — DX: Pseudarthrosis after fusion or arthrodesis: M96.0

## 2024-06-03 ENCOUNTER — Encounter: Payer: Self-pay | Admitting: *Deleted

## 2024-06-04 NOTE — Progress Notes (Signed)
 [facility]- [facility] Orthopaedic Spine Surgery Outpatient Visit     Chad Gordon DOB: 11-17-1945 MRN: [id number] 06/04/2024  Chief Complaint  Patient presents with  . Spine - Follow-up, Post-op    Doing better only has pain when standing for too long(same area)    PCP: No primary care provider on file. Referring Provider: No ref. provider found  HISTORY:  History of Present Illness: Chad Gordon  is a 78 y.o. male who presents 5 weeks s/p Revision L3-Pelvis on 04/29/2024 at [facility] with Dr. Marlyce.   Incision is completely healed. States that they are doing very well and is extremely pleased with their surgical outcomes. The patient reports the pre-operative symptoms of low back pain with buttock pain are greatly improving.   The patient denies associated bowel or bladder incontinence, saddle anesthesia, or upper or lower extremity weakness. They are no longer taking narcotic medications. He is using muscle relaxers once daily PRN. Pain is 1/10.    Reviewed and updated this visit by provider: Tobacco  Allergies  Meds  Problems  Med Hx  Surg Hx  Fam Hx       Physical EXAM: VITALS: BP (!) 190/113 (BP Location: Left Upper Arm, Patient Position: Sitting) Comment: 185 116  Pulse 90   Ht 6' 3 (1.905 m)   Wt 213 lb (96.6 kg)   BMI 26.62 kg/m  BMI Readings from Last 1 Encounters:  06/04/24 26.62 kg/m   Spine Musculoskeletal Exam  Gait   Gait is normal.   Antalgic: right and left   Limp: right and left  Inspection   Leg length disparity: no discrepancy   Thoracolumbar   Erythema: none   Swelling: none   Edema (right lower extremity): none       Prior incision: midline lumbar  Palpation   Thoracolumbar   Tenderness: present     Sciatic notch: right and left     Gluteal: right and left     Iliac crest: right and left     Sacrum: right and left  Strength   Thoracolumbar      Right     Tibialis anterior: 5/5.       Tibialis posterior: 5/5.      Plantar flexion: 5/5.      Quadriceps: 5/5.      Hamstring: 5/5.      Hip abductors: 5/5.      Hip flexion: 5/5.      Hip adduction: 5/5.       Left     Tibialis anterior: 5/5.      Tibialis posterior: 5/5.      Plantar flexion: 5/5.      Quadriceps: 5/5.      Hamstring: 5/5.      Hip abductors: 5/5.      Hip flexion: 5/5.      Hip adduction: 5/5.    Sensory   Thoracolumbar     Right     Anterior thigh: normal     Medial thigh: normal     Posterior thigh: normal     Lateral thigh: normal     Anterior knee: normal     Anterior leg: normal     Left     Anterior thigh: normal     Medial thigh: normal     Posterior thigh: normal     Lateral thigh: normal     Anterior knee: normal     Anterior leg: normal  Reflexes  Thoracolumbar reflexes are normal.   Right     Quadriceps: 2/4     Achilles: 2/4    Left     Quadriceps: 2/4     Achilles: 2/4  Special Tests   Thoracolumbar     Right     SLR: back pain     Left     SLR: back pain    IMAGING: I personally reviewed this/these film(s) and radiology report(s), with my interpretation as listed in the surrounding paragraph(s). Hardware intact and in place without subsidence, loosening, fractures, or other complications. Adjacent functional kyphosis  Spine Surgical History: Revision L3-Pelvis on 04/29/2024 by Dr. Marlyce Revision L3-S1 PSF/TLIF on 10/10/2022 by Dr. Marlyce.  L2-L5 decompression 12/13/2021 by Dr. Marlyce The patient has no red flag history indicative of complicated back pain.*   Problem LIst:   ICD-10-CM   1. 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 Loosening of spinal fixation device  T84.296A XR Spine  Lumbar 2-3 Views    Ambulatory referral to Physical Therapy      Assessment/Plan: Reviewed post operative activity and restrictions. Patient will participate in a daily progressive ambulation program. Patient was educated  on current diagnosis and the typical treatment algorithm. Questions were encouraged and answered today. Instructed to call with any new questions or concerns. Plan to start PT early with [facility] in [place].   Follow up for 3 Months Post Op, With [place] PA.  Orders Placed This Encounter  Procedures  . XR Spine  Lumbar 2-3 Views    Special Views to be Performed:   AP & Lateral  . Ambulatory referral to Physical Therapy    Standing Status:   Future    Expiration Date:   06/04/2025    Referral Priority:   Routine    Referral Type:   Physical Therapy    Referral Reason:   Evaluate and Return    Requested Specialty:   Physical Therapist    Number of Visits Requested:   1    Expiration Date:   11/30/2024    On the day of the encounter, a total of 30 minutes were spent on this patient encounter including history, physical exam, review of historical information, review of imaging, assessment, plan, documentation.   Electronically signed by Katelyn R Moffo, PA on 06/04/2024 at 3:24 PM

## 2024-06-05 ENCOUNTER — Ambulatory Visit

## 2024-06-05 VITALS — BP 160/80 | HR 72 | Ht 75.0 in | Wt 220.0 lb

## 2024-06-05 DIAGNOSIS — I2510 Atherosclerotic heart disease of native coronary artery without angina pectoris: Secondary | ICD-10-CM

## 2024-06-05 DIAGNOSIS — E782 Mixed hyperlipidemia: Secondary | ICD-10-CM

## 2024-06-05 DIAGNOSIS — I10 Essential (primary) hypertension: Secondary | ICD-10-CM

## 2024-06-05 MED ORDER — CARVEDILOL 12.5 MG PO TABS: 12.5000 mg | ORAL_TABLET | Freq: Two times a day (BID) | ORAL | 3 refills | Status: AC

## 2024-06-05 NOTE — Assessment & Plan Note (Signed)
 Functional status limited due to back pain. No cardiac symptoms at this time.  Continue with aspirin  81 mg once daily Rosuvastatin  40 mg once daily Continue blood pressure control aggressively as reviewed below under hypertension.
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
## 2024-06-05 NOTE — Assessment & Plan Note (Signed)
 Well-controlled in December 2024. Continue rosuvastatin  40 mg once daily.

## 2024-06-05 NOTE — Patient Instructions (Signed)
 Medication Instructions:  Your physician has recommended you make the following change in your medication:   STOP: Metoprolol  START: Carvedilol  12.5 mg two times daily  *If you need a refill on your cardiac medications before your next appointment, please call your pharmacy*  Lab Work: None If you have labs (blood work) drawn today and your tests are completely normal, you will receive your results only by: MyChart Message (if you have MyChart) OR A paper copy in the mail If you have any lab test that is abnormal or we need to change your treatment, we will call you to review the results.  Testing/Procedures: None  Follow-Up: At [facility], you and your health needs are our priority.  As part of our continuing mission to provide you with exceptional heart care, our providers are all part of one team.  This team includes your primary Cardiologist (physician) and Advanced Practice Providers or APPs (Physician Assistants and Nurse Practitioners) who all work together to provide you with the care you need, when you need it.  Your next appointment:   3 month(s)  Provider:   Alean Kobus, MD    We recommend signing up for the patient portal called MyChart.  Sign up information is provided on this After Visit Summary.  MyChart is used to connect with patients for Virtual Visits (Telemedicine).  Patients are able to view lab/test results, encounter notes, upcoming appointments, etc.  Non-urgent messages can be sent to your provider as well.   To learn more about what you can do with MyChart, go to [email or web address].   Other Instructions Nurse visit in 1 week to check blood pressure

## 2024-06-05 NOTE — Assessment & Plan Note (Signed)
 Suboptimal. Discontinue metoprolol . Start carvedilol  12.5 mg twice daily. Continue olmesartan  40 mg once daily. Target blood pressure below 130/80 mmHg.  Will return for nurse visit in 1 week with a blood pressure log if possible. If blood pressures remain uncontrolled will start amlodipine .

## 2024-06-12 ENCOUNTER — Ambulatory Visit: Attending: Cardiology

## 2024-06-12 NOTE — Progress Notes (Signed)
   Nurse Visit   Date of Encounter: 06/12/2024 ID: Chad Gordon Ip, DOB 1945/08/13, MRN [id number]  PCP:  Ofilia Lamar CROME, MD   [facility] Providers Cardiologist:  Alean SAUNDERS Madireddy, MD      Visit Details   VS:  BP 138/80 (BP Location: Right Arm, Patient Position: Sitting, Cuff Size: Normal)   Resp 20   Wt 222 lb (100.7 kg)   BMI 27.75 kg/m  , BMI Body mass index is 27.75 kg/m.  Wt Readings from Last 3 Encounters:  06/12/24 222 lb (100.7 kg)  06/05/24 220 lb (99.8 kg)  10/07/23 208 lb 12.8 oz (94.7 kg)     Reason for visit: Perform blood pressure check after changing medication Performed today: Provider consulted, Education and Vitals Changes (medications, testing, etc.) : No new orders at this time. Continue the same treatment regimen Length of Visit: 25 minutes    Medications Adjustments/Labs and Tests Ordered: No orders of the defined types were placed in this encounter.  No orders of the defined types were placed in this encounter.    Signed, Charlie LILLETTE Free, RN  06/12/2024 8:53 AM

## 2024-06-29 ENCOUNTER — Telehealth: Payer: Self-pay | Admitting: *Deleted

## 2024-06-29 DIAGNOSIS — R079 Chest pain, unspecified: Secondary | ICD-10-CM

## 2024-06-29 MED ORDER — OMEPRAZOLE 20 MG PO CPDR
20.0000 mg | DELAYED_RELEASE_CAPSULE | Freq: Every day | ORAL | 3 refills | Status: AC
Start: 2024-06-29 — End: ?

## 2024-06-29 NOTE — Telephone Encounter (Signed)
 Rx refill sent to pharmacy.

## 2024-08-21 DIAGNOSIS — K219 Gastro-esophageal reflux disease without esophagitis: Secondary | ICD-10-CM | POA: Insufficient documentation

## 2024-09-03 ENCOUNTER — Ambulatory Visit

## 2024-09-03 VITALS — BP 140/90 | HR 78 | Ht 75.0 in | Wt 225.4 lb

## 2024-09-03 DIAGNOSIS — I2510 Atherosclerotic heart disease of native coronary artery without angina pectoris: Secondary | ICD-10-CM | POA: Diagnosis not present

## 2024-09-03 DIAGNOSIS — I10 Essential (primary) hypertension: Secondary | ICD-10-CM | POA: Diagnosis not present

## 2024-09-03 DIAGNOSIS — E782 Mixed hyperlipidemia: Secondary | ICD-10-CM

## 2024-09-03 MED ORDER — AMLODIPINE BESYLATE 5 MG PO TABS: 5.0000 mg | ORAL_TABLET | Freq: Every day | ORAL | 3 refills | Status: AC
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
## 2024-09-03 NOTE — Assessment & Plan Note (Signed)
 Last lipid panel reviewed from December 2024, well-controlled. Continue rosuvastatin  40 mg once daily. Will defer to check lipid panel with his annual blood work. Target LDL below 70 mg/dL.

## 2024-09-03 NOTE — Assessment & Plan Note (Addendum)
 Back to his baseline functional status.  Somewhat limited due to the back pain.  Continue aspirin  81 mg once daily  continue rosuvastatin  40 mg once daily.

## 2024-09-03 NOTE — Patient Instructions (Signed)
 Medication Instructions:  Your physician has recommended you make the following change in your medication:   START: Amlodipine  5 mg daily  *If you need a refill on your cardiac medications before your next appointment, please call your pharmacy*  Lab Work: None If you have labs (blood work) drawn today and your tests are completely normal, you will receive your results only by: MyChart Message (if you have MyChart) OR A paper copy in the mail If you have any lab test that is abnormal or we need to change your treatment, we will call you to review the results.  Testing/Procedures: None  Follow-Up: At [facility], you and your health needs are our priority.  As part of our continuing mission to provide you with exceptional heart care, our providers are all part of one team.  This team includes your primary Cardiologist (physician) and Advanced Practice Providers or APPs (Physician Assistants and Nurse Practitioners) who all work together to provide you with the care you need, when you need it.  Your next appointment:   6 month(s)  Provider:   Alean Kobus, MD    We recommend signing up for the patient portal called MyChart.  Sign up information is provided on this After Visit Summary.  MyChart is used to connect with patients for Virtual Visits (Telemedicine).  Patients are able to view lab/test results, encounter notes, upcoming appointments, etc.  Non-urgent messages can be sent to your provider as well.   To learn more about what you can do with MyChart, go to [email or web address].   Other Instructions None

## 2024-09-03 NOTE — Assessment & Plan Note (Signed)
 Suboptimal today. Continue carvedilol  12.5 mg twice daily Continue olmesartan  40 mg once daily. Start amlodipine  5 mg once daily.  Discussed the mechanism of action, potential side effects. He is agreeable.  Target blood pressure below 130/80 mmHg.
